# Patient Record
Sex: Female | Born: 1935
Health system: Southern US, Community
[De-identification: ages and names within clinical notes are randomized; demographics above are authoritative.]

## PROBLEM LIST (undated history)

## (undated) DIAGNOSIS — E041 Nontoxic single thyroid nodule: Secondary | ICD-10-CM

## (undated) DIAGNOSIS — N3281 Overactive bladder: Secondary | ICD-10-CM

## (undated) DIAGNOSIS — H9190 Unspecified hearing loss, unspecified ear: Secondary | ICD-10-CM

## (undated) DIAGNOSIS — M1990 Unspecified osteoarthritis, unspecified site: Secondary | ICD-10-CM

## (undated) DIAGNOSIS — M47819 Spondylosis without myelopathy or radiculopathy, site unspecified: Secondary | ICD-10-CM

## (undated) DIAGNOSIS — I6529 Occlusion and stenosis of unspecified carotid artery: Secondary | ICD-10-CM

## (undated) DIAGNOSIS — E785 Hyperlipidemia, unspecified: Secondary | ICD-10-CM

## (undated) DIAGNOSIS — I10 Essential (primary) hypertension: Secondary | ICD-10-CM

## (undated) DIAGNOSIS — E663 Overweight: Secondary | ICD-10-CM

## (undated) DIAGNOSIS — M519 Unspecified thoracic, thoracolumbar and lumbosacral intervertebral disc disorder: Secondary | ICD-10-CM

## (undated) DIAGNOSIS — C439 Malignant melanoma of skin, unspecified: Secondary | ICD-10-CM

## (undated) DIAGNOSIS — M25569 Pain in unspecified knee: Secondary | ICD-10-CM

## (undated) DIAGNOSIS — S83249A Other tear of medial meniscus, current injury, unspecified knee, initial encounter: Secondary | ICD-10-CM

## (undated) HISTORY — DX: Pain in unspecified knee: M25.569

## (undated) HISTORY — DX: Unspecified osteoarthritis, unspecified site: M19.90

## (undated) HISTORY — DX: Occlusion and stenosis of unspecified carotid artery: I65.29

## (undated) HISTORY — DX: Unspecified thoracic, thoracolumbar and lumbosacral intervertebral disc disorder: M51.9

## (undated) HISTORY — DX: Overactive bladder: N32.81

## (undated) HISTORY — DX: Hyperlipidemia, unspecified: E78.5

## (undated) HISTORY — DX: Unspecified hearing loss, unspecified ear: H91.90

## (undated) HISTORY — PX: OTHER SURGICAL HISTORY: SHX169

## (undated) HISTORY — DX: Malignant melanoma of skin, unspecified: C43.9

## (undated) HISTORY — DX: Essential (primary) hypertension: I10

## (undated) HISTORY — PX: APPENDECTOMY: SHX54

## (undated) HISTORY — PX: VESICOVAGINAL FISTULA CLOSURE W/ TAH: SUR271

## (undated) HISTORY — PX: CATARACT EXTRACTION: SUR2

## (undated) HISTORY — PX: EXPLORATORY LAPAROTOMY: SUR591

## (undated) HISTORY — DX: Other tear of medial meniscus, current injury, unspecified knee, initial encounter: S83.249A

## (undated) HISTORY — DX: Overweight: E66.3

## (undated) HISTORY — DX: Nontoxic single thyroid nodule: E04.1

## (undated) HISTORY — DX: Spondylosis without myelopathy or radiculopathy, site unspecified: M47.819

---

## 2001-02-15 ENCOUNTER — Other Ambulatory Visit: Admission: RE | Admit: 2001-02-15 | Discharge: 2001-02-15 | Payer: Self-pay | Admitting: Family Medicine

## 2001-03-02 ENCOUNTER — Encounter: Payer: Self-pay | Admitting: Family Medicine

## 2001-03-02 ENCOUNTER — Ambulatory Visit (HOSPITAL_COMMUNITY): Admission: RE | Admit: 2001-03-02 | Discharge: 2001-03-02 | Payer: Self-pay | Admitting: Family Medicine

## 2001-10-20 ENCOUNTER — Ambulatory Visit (HOSPITAL_COMMUNITY): Admission: RE | Admit: 2001-10-20 | Discharge: 2001-10-20 | Payer: Self-pay | Admitting: General Surgery

## 2001-12-13 ENCOUNTER — Encounter: Payer: Self-pay | Admitting: Family Medicine

## 2001-12-13 ENCOUNTER — Ambulatory Visit (HOSPITAL_COMMUNITY): Admission: RE | Admit: 2001-12-13 | Discharge: 2001-12-13 | Payer: Self-pay | Admitting: Family Medicine

## 2002-03-02 ENCOUNTER — Encounter: Payer: Self-pay | Admitting: Family Medicine

## 2002-03-02 ENCOUNTER — Ambulatory Visit (HOSPITAL_COMMUNITY): Admission: RE | Admit: 2002-03-02 | Discharge: 2002-03-02 | Payer: Self-pay | Admitting: Family Medicine

## 2003-03-06 ENCOUNTER — Ambulatory Visit (HOSPITAL_COMMUNITY): Admission: RE | Admit: 2003-03-06 | Discharge: 2003-03-06 | Payer: Self-pay | Admitting: Family Medicine

## 2003-12-11 ENCOUNTER — Other Ambulatory Visit: Admission: RE | Admit: 2003-12-11 | Discharge: 2003-12-11 | Payer: Self-pay | Admitting: General Surgery

## 2004-03-07 ENCOUNTER — Ambulatory Visit (HOSPITAL_COMMUNITY): Admission: RE | Admit: 2004-03-07 | Discharge: 2004-03-07 | Payer: Self-pay | Admitting: Family Medicine

## 2004-07-29 ENCOUNTER — Ambulatory Visit: Payer: Self-pay | Admitting: Orthopedic Surgery

## 2005-02-02 ENCOUNTER — Encounter (INDEPENDENT_AMBULATORY_CARE_PROVIDER_SITE_OTHER): Payer: Self-pay | Admitting: Family Medicine

## 2005-02-02 LAB — CONVERTED CEMR LAB: Pap Smear: NORMAL

## 2005-03-10 ENCOUNTER — Ambulatory Visit (HOSPITAL_COMMUNITY): Admission: RE | Admit: 2005-03-10 | Discharge: 2005-03-10 | Payer: Self-pay | Admitting: Family Medicine

## 2005-06-12 ENCOUNTER — Ambulatory Visit: Payer: Self-pay | Admitting: Orthopedic Surgery

## 2005-06-19 ENCOUNTER — Ambulatory Visit (HOSPITAL_COMMUNITY): Admission: RE | Admit: 2005-06-19 | Discharge: 2005-06-19 | Payer: Self-pay | Admitting: General Surgery

## 2005-10-01 ENCOUNTER — Encounter (INDEPENDENT_AMBULATORY_CARE_PROVIDER_SITE_OTHER): Payer: Self-pay | Admitting: Family Medicine

## 2005-10-01 LAB — CONVERTED CEMR LAB: Blood Glucose, Fasting: 111 mg/dL

## 2005-12-29 ENCOUNTER — Ambulatory Visit: Payer: Self-pay | Admitting: Orthopedic Surgery

## 2006-02-02 ENCOUNTER — Ambulatory Visit: Payer: Self-pay | Admitting: Family Medicine

## 2006-03-17 ENCOUNTER — Encounter (INDEPENDENT_AMBULATORY_CARE_PROVIDER_SITE_OTHER): Payer: Self-pay | Admitting: Family Medicine

## 2006-03-18 ENCOUNTER — Ambulatory Visit: Payer: Self-pay | Admitting: Family Medicine

## 2006-03-19 ENCOUNTER — Ambulatory Visit (HOSPITAL_COMMUNITY): Admission: RE | Admit: 2006-03-19 | Discharge: 2006-03-19 | Payer: Self-pay | Admitting: Family Medicine

## 2006-04-07 ENCOUNTER — Encounter: Payer: Self-pay | Admitting: Family Medicine

## 2006-04-07 DIAGNOSIS — K5900 Constipation, unspecified: Secondary | ICD-10-CM | POA: Insufficient documentation

## 2006-04-07 DIAGNOSIS — H269 Unspecified cataract: Secondary | ICD-10-CM | POA: Insufficient documentation

## 2006-04-07 DIAGNOSIS — N318 Other neuromuscular dysfunction of bladder: Secondary | ICD-10-CM | POA: Insufficient documentation

## 2006-04-07 DIAGNOSIS — I10 Essential (primary) hypertension: Secondary | ICD-10-CM | POA: Insufficient documentation

## 2006-04-07 DIAGNOSIS — H409 Unspecified glaucoma: Secondary | ICD-10-CM | POA: Insufficient documentation

## 2006-04-07 DIAGNOSIS — M129 Arthropathy, unspecified: Secondary | ICD-10-CM | POA: Insufficient documentation

## 2006-04-07 DIAGNOSIS — E785 Hyperlipidemia, unspecified: Secondary | ICD-10-CM | POA: Insufficient documentation

## 2006-04-13 ENCOUNTER — Ambulatory Visit: Payer: Self-pay | Admitting: Family Medicine

## 2006-04-14 ENCOUNTER — Ambulatory Visit (HOSPITAL_COMMUNITY): Admission: RE | Admit: 2006-04-14 | Discharge: 2006-04-14 | Payer: Self-pay | Admitting: Family Medicine

## 2006-04-16 ENCOUNTER — Encounter (INDEPENDENT_AMBULATORY_CARE_PROVIDER_SITE_OTHER): Payer: Self-pay | Admitting: Family Medicine

## 2006-04-16 ENCOUNTER — Ambulatory Visit (HOSPITAL_COMMUNITY): Admission: RE | Admit: 2006-04-16 | Discharge: 2006-04-16 | Payer: Self-pay | Admitting: Family Medicine

## 2006-05-11 ENCOUNTER — Encounter (INDEPENDENT_AMBULATORY_CARE_PROVIDER_SITE_OTHER): Payer: Self-pay | Admitting: Family Medicine

## 2006-05-11 ENCOUNTER — Ambulatory Visit: Payer: Self-pay | Admitting: Internal Medicine

## 2006-07-27 ENCOUNTER — Encounter (INDEPENDENT_AMBULATORY_CARE_PROVIDER_SITE_OTHER): Payer: Self-pay | Admitting: Family Medicine

## 2006-08-10 ENCOUNTER — Ambulatory Visit: Payer: Self-pay | Admitting: Family Medicine

## 2006-08-10 LAB — CONVERTED CEMR LAB: HDL goal, serum: 40 mg/dL

## 2006-08-11 LAB — CONVERTED CEMR LAB
Alkaline Phosphatase: 50 units/L (ref 39–117)
CO2: 24 meq/L (ref 19–32)
Cholesterol: 221 mg/dL — ABNORMAL HIGH (ref 0–200)
Creatinine, Ser: 0.76 mg/dL (ref 0.40–1.20)
Glucose, Bld: 100 mg/dL — ABNORMAL HIGH (ref 70–99)
LDL Cholesterol: 145 mg/dL — ABNORMAL HIGH (ref 0–99)
Sodium: 143 meq/L (ref 135–145)
Total Bilirubin: 0.7 mg/dL (ref 0.3–1.2)
Total CHOL/HDL Ratio: 4.3
Triglycerides: 125 mg/dL (ref <150)
VLDL: 25 mg/dL (ref 0–40)

## 2006-08-20 ENCOUNTER — Telehealth (INDEPENDENT_AMBULATORY_CARE_PROVIDER_SITE_OTHER): Payer: Self-pay | Admitting: Family Medicine

## 2006-08-24 ENCOUNTER — Ambulatory Visit (HOSPITAL_COMMUNITY): Admission: RE | Admit: 2006-08-24 | Discharge: 2006-08-24 | Payer: Self-pay | Admitting: Family Medicine

## 2006-08-26 ENCOUNTER — Encounter (INDEPENDENT_AMBULATORY_CARE_PROVIDER_SITE_OTHER): Payer: Self-pay | Admitting: Family Medicine

## 2006-08-26 DIAGNOSIS — IMO0002 Reserved for concepts with insufficient information to code with codable children: Secondary | ICD-10-CM | POA: Insufficient documentation

## 2006-09-23 ENCOUNTER — Ambulatory Visit: Payer: Self-pay | Admitting: Orthopedic Surgery

## 2006-09-25 ENCOUNTER — Ambulatory Visit: Payer: Self-pay | Admitting: Family Medicine

## 2006-09-29 LAB — CONVERTED CEMR LAB
ALT: 25 units/L (ref 0–35)
Albumin: 4.2 g/dL (ref 3.5–5.2)
Total Protein: 6.6 g/dL (ref 6.0–8.3)

## 2006-10-14 ENCOUNTER — Telehealth (INDEPENDENT_AMBULATORY_CARE_PROVIDER_SITE_OTHER): Payer: Self-pay | Admitting: Family Medicine

## 2006-10-26 ENCOUNTER — Ambulatory Visit: Payer: Self-pay | Admitting: Orthopedic Surgery

## 2006-10-26 ENCOUNTER — Encounter (INDEPENDENT_AMBULATORY_CARE_PROVIDER_SITE_OTHER): Payer: Self-pay | Admitting: Family Medicine

## 2006-10-28 ENCOUNTER — Telehealth (INDEPENDENT_AMBULATORY_CARE_PROVIDER_SITE_OTHER): Payer: Self-pay | Admitting: Family Medicine

## 2006-11-17 ENCOUNTER — Encounter (INDEPENDENT_AMBULATORY_CARE_PROVIDER_SITE_OTHER): Payer: Self-pay | Admitting: Family Medicine

## 2006-11-18 ENCOUNTER — Ambulatory Visit: Payer: Self-pay | Admitting: Family Medicine

## 2006-11-18 DIAGNOSIS — E663 Overweight: Secondary | ICD-10-CM | POA: Insufficient documentation

## 2006-11-20 ENCOUNTER — Encounter (INDEPENDENT_AMBULATORY_CARE_PROVIDER_SITE_OTHER): Payer: Self-pay | Admitting: Family Medicine

## 2006-11-22 LAB — CONVERTED CEMR LAB
ALT: 18 units/L (ref 0–35)
AST: 19 units/L (ref 0–37)
Albumin: 4.2 g/dL (ref 3.5–5.2)
Alkaline Phosphatase: 49 units/L (ref 39–117)
Chloride: 104 meq/L (ref 96–112)
HDL: 66 mg/dL (ref >39)
LDL Cholesterol: 103 mg/dL — ABNORMAL HIGH (ref 0–99)
Potassium: 3.9 meq/L (ref 3.5–5.3)
Sodium: 143 meq/L (ref 135–145)
Total Protein: 6.6 g/dL (ref 6.0–8.3)

## 2006-11-23 ENCOUNTER — Encounter (INDEPENDENT_AMBULATORY_CARE_PROVIDER_SITE_OTHER): Payer: Self-pay | Admitting: Family Medicine

## 2006-11-23 ENCOUNTER — Telehealth (INDEPENDENT_AMBULATORY_CARE_PROVIDER_SITE_OTHER): Payer: Self-pay | Admitting: *Deleted

## 2006-11-23 ENCOUNTER — Ambulatory Visit: Payer: Self-pay | Admitting: Orthopedic Surgery

## 2006-12-09 ENCOUNTER — Ambulatory Visit: Payer: Self-pay | Admitting: Orthopedic Surgery

## 2006-12-30 ENCOUNTER — Ambulatory Visit: Payer: Self-pay | Admitting: Orthopedic Surgery

## 2006-12-31 ENCOUNTER — Encounter: Payer: Self-pay | Admitting: Orthopedic Surgery

## 2007-01-01 ENCOUNTER — Ambulatory Visit (HOSPITAL_COMMUNITY): Admission: RE | Admit: 2007-01-01 | Discharge: 2007-01-01 | Payer: Self-pay | Admitting: Orthopedic Surgery

## 2007-01-01 ENCOUNTER — Ambulatory Visit: Payer: Self-pay | Admitting: Orthopedic Surgery

## 2007-01-05 ENCOUNTER — Ambulatory Visit: Payer: Self-pay | Admitting: Orthopedic Surgery

## 2007-01-06 ENCOUNTER — Encounter (HOSPITAL_COMMUNITY): Admission: RE | Admit: 2007-01-06 | Discharge: 2007-02-02 | Payer: Self-pay | Admitting: Orthopedic Surgery

## 2007-01-12 ENCOUNTER — Telehealth (INDEPENDENT_AMBULATORY_CARE_PROVIDER_SITE_OTHER): Payer: Self-pay | Admitting: *Deleted

## 2007-01-20 ENCOUNTER — Ambulatory Visit: Payer: Self-pay | Admitting: Orthopedic Surgery

## 2007-02-04 ENCOUNTER — Ambulatory Visit: Payer: Self-pay | Admitting: Family Medicine

## 2007-02-04 DIAGNOSIS — H9190 Unspecified hearing loss, unspecified ear: Secondary | ICD-10-CM | POA: Insufficient documentation

## 2007-02-10 ENCOUNTER — Telehealth (INDEPENDENT_AMBULATORY_CARE_PROVIDER_SITE_OTHER): Payer: Self-pay | Admitting: *Deleted

## 2007-02-17 ENCOUNTER — Ambulatory Visit: Payer: Self-pay | Admitting: Orthopedic Surgery

## 2007-02-17 DIAGNOSIS — M479 Spondylosis, unspecified: Secondary | ICD-10-CM | POA: Insufficient documentation

## 2007-02-23 ENCOUNTER — Telehealth: Payer: Self-pay | Admitting: Orthopedic Surgery

## 2007-02-26 ENCOUNTER — Ambulatory Visit (HOSPITAL_COMMUNITY): Admission: RE | Admit: 2007-02-26 | Discharge: 2007-02-26 | Payer: Self-pay | Admitting: Orthopedic Surgery

## 2007-03-12 ENCOUNTER — Ambulatory Visit: Payer: Self-pay | Admitting: Family Medicine

## 2007-03-12 DIAGNOSIS — M5137 Other intervertebral disc degeneration, lumbosacral region: Secondary | ICD-10-CM

## 2007-03-12 LAB — CONVERTED CEMR LAB: LDL Goal: 160 mg/dL

## 2007-03-15 ENCOUNTER — Encounter: Payer: Self-pay | Admitting: Orthopedic Surgery

## 2007-03-18 ENCOUNTER — Telehealth (INDEPENDENT_AMBULATORY_CARE_PROVIDER_SITE_OTHER): Payer: Self-pay | Admitting: Family Medicine

## 2007-03-20 ENCOUNTER — Encounter (INDEPENDENT_AMBULATORY_CARE_PROVIDER_SITE_OTHER): Payer: Self-pay | Admitting: Family Medicine

## 2007-03-29 ENCOUNTER — Encounter: Admission: RE | Admit: 2007-03-29 | Discharge: 2007-03-29 | Payer: Self-pay | Admitting: Orthopedic Surgery

## 2007-03-30 ENCOUNTER — Encounter: Payer: Self-pay | Admitting: Orthopedic Surgery

## 2007-04-10 ENCOUNTER — Encounter (INDEPENDENT_AMBULATORY_CARE_PROVIDER_SITE_OTHER): Payer: Self-pay | Admitting: Family Medicine

## 2007-04-12 ENCOUNTER — Encounter: Admission: RE | Admit: 2007-04-12 | Discharge: 2007-04-12 | Payer: Self-pay | Admitting: Orthopedic Surgery

## 2007-04-21 ENCOUNTER — Ambulatory Visit: Payer: Self-pay | Admitting: Family Medicine

## 2007-04-21 ENCOUNTER — Telehealth (INDEPENDENT_AMBULATORY_CARE_PROVIDER_SITE_OTHER): Payer: Self-pay | Admitting: *Deleted

## 2007-04-22 ENCOUNTER — Telehealth (INDEPENDENT_AMBULATORY_CARE_PROVIDER_SITE_OTHER): Payer: Self-pay | Admitting: *Deleted

## 2007-04-22 LAB — CONVERTED CEMR LAB
ALT: 22 units/L (ref 0–35)
Basophils Absolute: 0 10*3/uL (ref 0.0–0.1)
CO2: 24 meq/L (ref 19–32)
Calcium: 9.2 mg/dL (ref 8.4–10.5)
Chloride: 101 meq/L (ref 96–112)
Cholesterol: 202 mg/dL — ABNORMAL HIGH (ref 0–200)
Creatinine, Ser: 0.8 mg/dL (ref 0.40–1.20)
Glucose, Bld: 103 mg/dL — ABNORMAL HIGH (ref 70–99)
Hemoglobin: 13.6 g/dL (ref 12.0–15.0)
Lymphocytes Relative: 35 % (ref 12–46)
Lymphs Abs: 2.1 10*3/uL (ref 0.7–4.0)
Monocytes Absolute: 0.6 10*3/uL (ref 0.1–1.0)
Neutro Abs: 3.3 10*3/uL (ref 1.7–7.7)
RDW: 15.8 % — ABNORMAL HIGH (ref 11.5–15.5)
Sodium: 140 meq/L (ref 135–145)
Total Protein: 6.7 g/dL (ref 6.0–8.3)
WBC: 6 10*3/uL (ref 4.0–10.5)

## 2007-04-23 ENCOUNTER — Ambulatory Visit (HOSPITAL_COMMUNITY): Admission: RE | Admit: 2007-04-23 | Discharge: 2007-04-23 | Payer: Self-pay | Admitting: Family Medicine

## 2007-04-27 ENCOUNTER — Telehealth (INDEPENDENT_AMBULATORY_CARE_PROVIDER_SITE_OTHER): Payer: Self-pay | Admitting: *Deleted

## 2007-05-05 ENCOUNTER — Ambulatory Visit: Payer: Self-pay | Admitting: Family Medicine

## 2007-05-05 ENCOUNTER — Telehealth (INDEPENDENT_AMBULATORY_CARE_PROVIDER_SITE_OTHER): Payer: Self-pay | Admitting: *Deleted

## 2007-05-11 ENCOUNTER — Telehealth (INDEPENDENT_AMBULATORY_CARE_PROVIDER_SITE_OTHER): Payer: Self-pay | Admitting: *Deleted

## 2007-05-11 ENCOUNTER — Ambulatory Visit (HOSPITAL_COMMUNITY): Admission: RE | Admit: 2007-05-11 | Discharge: 2007-05-11 | Payer: Self-pay | Admitting: Family Medicine

## 2007-05-11 ENCOUNTER — Encounter (INDEPENDENT_AMBULATORY_CARE_PROVIDER_SITE_OTHER): Payer: Self-pay | Admitting: Family Medicine

## 2007-05-14 ENCOUNTER — Ambulatory Visit (HOSPITAL_COMMUNITY): Admission: RE | Admit: 2007-05-14 | Discharge: 2007-05-14 | Payer: Self-pay | Admitting: Family Medicine

## 2007-05-14 ENCOUNTER — Encounter (INDEPENDENT_AMBULATORY_CARE_PROVIDER_SITE_OTHER): Payer: Self-pay | Admitting: Family Medicine

## 2007-05-14 ENCOUNTER — Encounter (INDEPENDENT_AMBULATORY_CARE_PROVIDER_SITE_OTHER): Payer: Self-pay | Admitting: Diagnostic Radiology

## 2007-05-19 ENCOUNTER — Telehealth (INDEPENDENT_AMBULATORY_CARE_PROVIDER_SITE_OTHER): Payer: Self-pay | Admitting: *Deleted

## 2007-05-20 ENCOUNTER — Telehealth (INDEPENDENT_AMBULATORY_CARE_PROVIDER_SITE_OTHER): Payer: Self-pay | Admitting: *Deleted

## 2007-06-04 ENCOUNTER — Ambulatory Visit: Payer: Self-pay | Admitting: Family Medicine

## 2007-06-04 LAB — CONVERTED CEMR LAB

## 2007-07-26 ENCOUNTER — Telehealth: Payer: Self-pay | Admitting: Orthopedic Surgery

## 2007-07-30 ENCOUNTER — Telehealth: Payer: Self-pay | Admitting: Orthopedic Surgery

## 2007-08-02 ENCOUNTER — Encounter: Payer: Self-pay | Admitting: Orthopedic Surgery

## 2007-08-06 ENCOUNTER — Inpatient Hospital Stay (HOSPITAL_COMMUNITY): Admission: RE | Admit: 2007-08-06 | Discharge: 2007-08-13 | Payer: Self-pay | Admitting: Neurosurgery

## 2007-08-20 ENCOUNTER — Telehealth (INDEPENDENT_AMBULATORY_CARE_PROVIDER_SITE_OTHER): Payer: Self-pay | Admitting: Family Medicine

## 2007-08-31 ENCOUNTER — Encounter: Payer: Self-pay | Admitting: Orthopedic Surgery

## 2007-09-01 ENCOUNTER — Ambulatory Visit: Payer: Self-pay | Admitting: Family Medicine

## 2007-09-01 LAB — CONVERTED CEMR LAB
Bilirubin Urine: NEGATIVE
Blood in Urine, dipstick: NEGATIVE
Ketones, urine, test strip: NEGATIVE

## 2007-10-06 ENCOUNTER — Ambulatory Visit: Payer: Self-pay | Admitting: Family Medicine

## 2007-10-07 ENCOUNTER — Encounter (INDEPENDENT_AMBULATORY_CARE_PROVIDER_SITE_OTHER): Payer: Self-pay | Admitting: Family Medicine

## 2007-10-08 LAB — CONVERTED CEMR LAB
ALT: 11 units/L (ref 0–35)
AST: 15 units/L (ref 0–37)
Albumin: 4 g/dL (ref 3.5–5.2)
Calcium: 9 mg/dL (ref 8.4–10.5)
Chloride: 103 meq/L (ref 96–112)
Potassium: 4 meq/L (ref 3.5–5.3)
Sodium: 142 meq/L (ref 135–145)

## 2007-10-12 ENCOUNTER — Encounter: Payer: Self-pay | Admitting: Orthopedic Surgery

## 2008-01-13 ENCOUNTER — Ambulatory Visit: Payer: Self-pay | Admitting: Family Medicine

## 2008-01-13 LAB — CONVERTED CEMR LAB
Ketones, urine, test strip: NEGATIVE
Nitrite: NEGATIVE
Specific Gravity, Urine: 1.015
WBC Urine, dipstick: NEGATIVE
pH: 7.5

## 2008-03-23 ENCOUNTER — Telehealth (INDEPENDENT_AMBULATORY_CARE_PROVIDER_SITE_OTHER): Payer: Self-pay | Admitting: *Deleted

## 2008-04-06 ENCOUNTER — Encounter: Payer: Self-pay | Admitting: Orthopedic Surgery

## 2008-04-14 ENCOUNTER — Ambulatory Visit: Payer: Self-pay | Admitting: Family Medicine

## 2008-04-19 ENCOUNTER — Encounter (INDEPENDENT_AMBULATORY_CARE_PROVIDER_SITE_OTHER): Payer: Self-pay | Admitting: Family Medicine

## 2008-04-20 LAB — CONVERTED CEMR LAB
AST: 21 units/L (ref 0–37)
Alkaline Phosphatase: 62 units/L (ref 39–117)
BUN: 17 mg/dL (ref 6–23)
Basophils Relative: 0 % (ref 0–1)
Creatinine, Ser: 1.1 mg/dL (ref 0.40–1.20)
Eosinophils Absolute: 0.2 10*3/uL (ref 0.0–0.7)
Eosinophils Relative: 5 % (ref 0–5)
HDL: 63 mg/dL (ref >39)
LDL Cholesterol: 113 mg/dL — ABNORMAL HIGH (ref 0–99)
MCHC: 30.9 g/dL (ref 30.0–36.0)
MCV: 88.5 fL (ref 78.0–100.0)
Monocytes Absolute: 0.6 10*3/uL (ref 0.1–1.0)
Monocytes Relative: 14 % — ABNORMAL HIGH (ref 3–12)
Neutrophils Relative %: 27 % — ABNORMAL LOW (ref 43–77)
Potassium: 3.8 meq/L (ref 3.5–5.3)
RBC: 4.68 M/uL (ref 3.87–5.11)
TSH: 1.325 microintl units/mL (ref 0.350–4.50)
Total Bilirubin: 0.5 mg/dL (ref 0.3–1.2)
Total CHOL/HDL Ratio: 3
VLDL: 15 mg/dL (ref 0–40)

## 2008-05-19 ENCOUNTER — Ambulatory Visit (HOSPITAL_COMMUNITY): Admission: RE | Admit: 2008-05-19 | Discharge: 2008-05-19 | Payer: Self-pay | Admitting: Family Medicine

## 2008-05-23 ENCOUNTER — Encounter (INDEPENDENT_AMBULATORY_CARE_PROVIDER_SITE_OTHER): Payer: Self-pay | Admitting: Family Medicine

## 2008-05-24 ENCOUNTER — Ambulatory Visit (HOSPITAL_COMMUNITY): Admission: RE | Admit: 2008-05-24 | Discharge: 2008-05-24 | Payer: Self-pay | Admitting: Family Medicine

## 2008-05-30 ENCOUNTER — Encounter (INDEPENDENT_AMBULATORY_CARE_PROVIDER_SITE_OTHER): Payer: Self-pay | Admitting: Family Medicine

## 2008-06-01 ENCOUNTER — Ambulatory Visit (HOSPITAL_COMMUNITY): Admission: RE | Admit: 2008-06-01 | Discharge: 2008-06-01 | Payer: Self-pay | Admitting: Family Medicine

## 2008-07-03 ENCOUNTER — Telehealth (INDEPENDENT_AMBULATORY_CARE_PROVIDER_SITE_OTHER): Payer: Self-pay | Admitting: Family Medicine

## 2008-07-13 ENCOUNTER — Ambulatory Visit: Payer: Self-pay | Admitting: Family Medicine

## 2008-07-13 DIAGNOSIS — M899 Disorder of bone, unspecified: Secondary | ICD-10-CM | POA: Insufficient documentation

## 2008-07-13 DIAGNOSIS — M949 Disorder of cartilage, unspecified: Secondary | ICD-10-CM

## 2008-08-01 ENCOUNTER — Ambulatory Visit: Payer: Self-pay | Admitting: Family Medicine

## 2008-08-01 DIAGNOSIS — R5381 Other malaise: Secondary | ICD-10-CM

## 2008-08-01 DIAGNOSIS — R5383 Other fatigue: Secondary | ICD-10-CM

## 2008-08-01 LAB — CONVERTED CEMR LAB
Nitrite: NEGATIVE
Protein, U semiquant: NEGATIVE
Specific Gravity, Urine: 1.015
pH: 5.5

## 2008-08-02 ENCOUNTER — Encounter (INDEPENDENT_AMBULATORY_CARE_PROVIDER_SITE_OTHER): Payer: Self-pay | Admitting: Family Medicine

## 2008-08-02 LAB — CONVERTED CEMR LAB
ALT: 14 units/L (ref 0–35)
BUN: 12 mg/dL (ref 6–23)
Basophils Relative: 1 % (ref 0–1)
CO2: 27 meq/L (ref 19–32)
Calcium: 9.3 mg/dL (ref 8.4–10.5)
Chloride: 101 meq/L (ref 96–112)
Creatinine, Ser: 0.83 mg/dL (ref 0.40–1.20)
Eosinophils Absolute: 0.1 10*3/uL (ref 0.0–0.7)
Eosinophils Relative: 2 % (ref 0–5)
HCT: 40.1 % (ref 36.0–46.0)
MCHC: 32.7 g/dL (ref 30.0–36.0)
MCV: 83.5 fL (ref 78.0–100.0)
Monocytes Relative: 7 % (ref 3–12)
Neutrophils Relative %: 34 % — ABNORMAL LOW (ref 43–77)
Platelets: 228 10*3/uL (ref 150–400)
TSH: 2.795 microintl units/mL (ref 0.350–4.500)
Total Bilirubin: 0.4 mg/dL (ref 0.3–1.2)

## 2008-08-03 ENCOUNTER — Ambulatory Visit: Payer: Self-pay | Admitting: Family Medicine

## 2008-08-08 ENCOUNTER — Encounter: Payer: Self-pay | Admitting: Orthopedic Surgery

## 2008-09-06 ENCOUNTER — Ambulatory Visit: Payer: Self-pay | Admitting: Internal Medicine

## 2008-09-06 DIAGNOSIS — J309 Allergic rhinitis, unspecified: Secondary | ICD-10-CM | POA: Insufficient documentation

## 2008-09-18 ENCOUNTER — Telehealth (INDEPENDENT_AMBULATORY_CARE_PROVIDER_SITE_OTHER): Payer: Self-pay | Admitting: Family Medicine

## 2008-10-06 ENCOUNTER — Ambulatory Visit: Payer: Self-pay | Admitting: Family Medicine

## 2008-10-06 DIAGNOSIS — T6391XA Toxic effect of contact with unspecified venomous animal, accidental (unintentional), initial encounter: Secondary | ICD-10-CM | POA: Insufficient documentation

## 2008-11-09 ENCOUNTER — Telehealth (INDEPENDENT_AMBULATORY_CARE_PROVIDER_SITE_OTHER): Payer: Self-pay | Admitting: *Deleted

## 2008-12-27 ENCOUNTER — Ambulatory Visit: Payer: Self-pay | Admitting: Family Medicine

## 2008-12-28 ENCOUNTER — Encounter (INDEPENDENT_AMBULATORY_CARE_PROVIDER_SITE_OTHER): Payer: Self-pay | Admitting: Family Medicine

## 2008-12-29 LAB — CONVERTED CEMR LAB
Albumin: 4.3 g/dL (ref 3.5–5.2)
Alkaline Phosphatase: 53 units/L (ref 39–117)
BUN: 13 mg/dL (ref 6–23)
Basophils Absolute: 0 10*3/uL (ref 0.0–0.1)
CO2: 27 meq/L (ref 19–32)
Calcium: 10.2 mg/dL (ref 8.4–10.5)
Chloride: 104 meq/L (ref 96–112)
Eosinophils Relative: 4 % (ref 0–5)
Glucose, Bld: 102 mg/dL — ABNORMAL HIGH (ref 70–99)
HCT: 41.3 % (ref 36.0–46.0)
HDL: 64 mg/dL (ref >39)
Hemoglobin: 13.5 g/dL (ref 12.0–15.0)
Ketones, ur: NEGATIVE mg/dL
LDL Cholesterol: 115 mg/dL — ABNORMAL HIGH (ref 0–99)
Lymphocytes Relative: 46 % (ref 12–46)
Lymphs Abs: 2.1 10*3/uL (ref 0.7–4.0)
Monocytes Absolute: 0.5 10*3/uL (ref 0.1–1.0)
Monocytes Relative: 11 % (ref 3–12)
Neutro Abs: 1.8 10*3/uL (ref 1.7–7.7)
Nitrite: NEGATIVE
Potassium: 4.6 meq/L (ref 3.5–5.3)
RBC: 4.77 M/uL (ref 3.87–5.11)
RDW: 14.6 % (ref 11.5–15.5)
Sodium: 144 meq/L (ref 135–145)
Specific Gravity, Urine: 1.023 (ref 1.005–1.030)
Total Protein: 7 g/dL (ref 6.0–8.3)
Triglycerides: 103 mg/dL (ref <150)
Urobilinogen, UA: 0.2 (ref 0.0–1.0)
pH: 6.5 (ref 5.0–8.0)

## 2009-01-01 ENCOUNTER — Ambulatory Visit (HOSPITAL_COMMUNITY): Admission: RE | Admit: 2009-01-01 | Discharge: 2009-01-01 | Payer: Self-pay | Admitting: Family Medicine

## 2009-01-01 ENCOUNTER — Ambulatory Visit: Payer: Self-pay | Admitting: Family Medicine

## 2009-01-01 DIAGNOSIS — T148 Other injury of unspecified body region: Secondary | ICD-10-CM

## 2009-01-01 DIAGNOSIS — W57XXXA Bitten or stung by nonvenomous insect and other nonvenomous arthropods, initial encounter: Secondary | ICD-10-CM

## 2009-01-23 ENCOUNTER — Encounter (INDEPENDENT_AMBULATORY_CARE_PROVIDER_SITE_OTHER): Payer: Self-pay | Admitting: Family Medicine

## 2009-02-03 IMAGING — MG MM DIGITAL SCREENING
5 series · 5 of 5 positions shown · non-contrast
Comparison: Prior studies 04-14-06.

DG SCREEN MAMMOGRAM BILATERAL
Bilateral CC and MLO view(s) were taken.

DIGITAL SCREENING MAMMOGRAM WITH CAD:

[L CC]
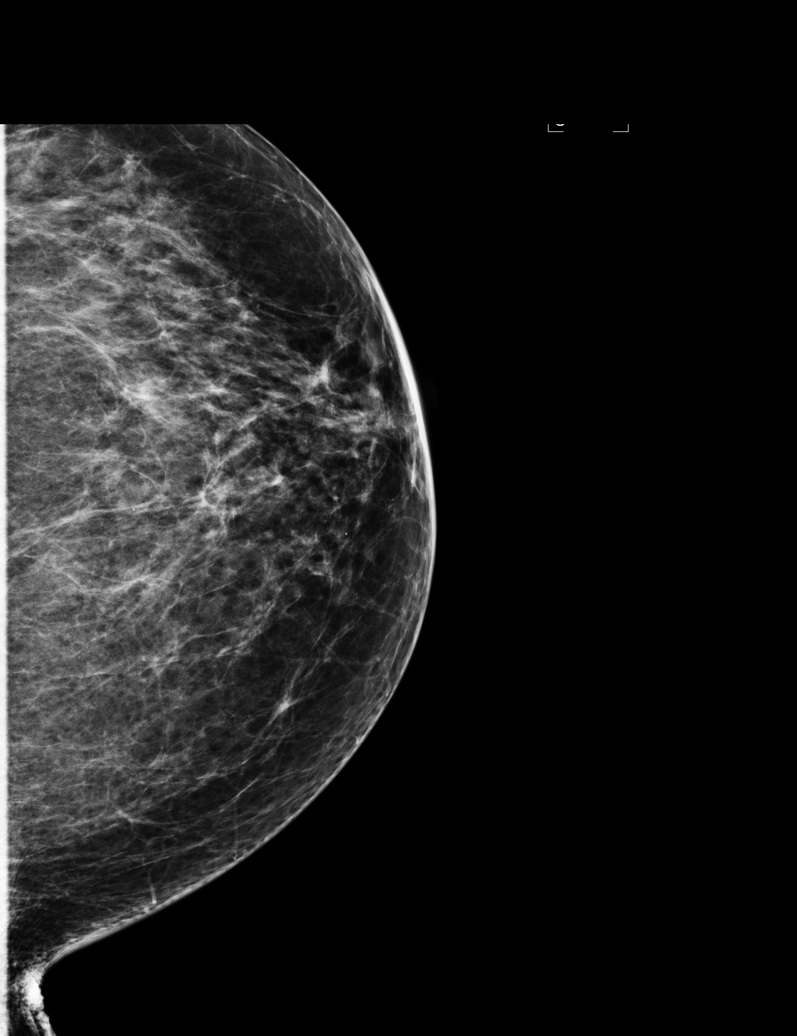

[L MLO]
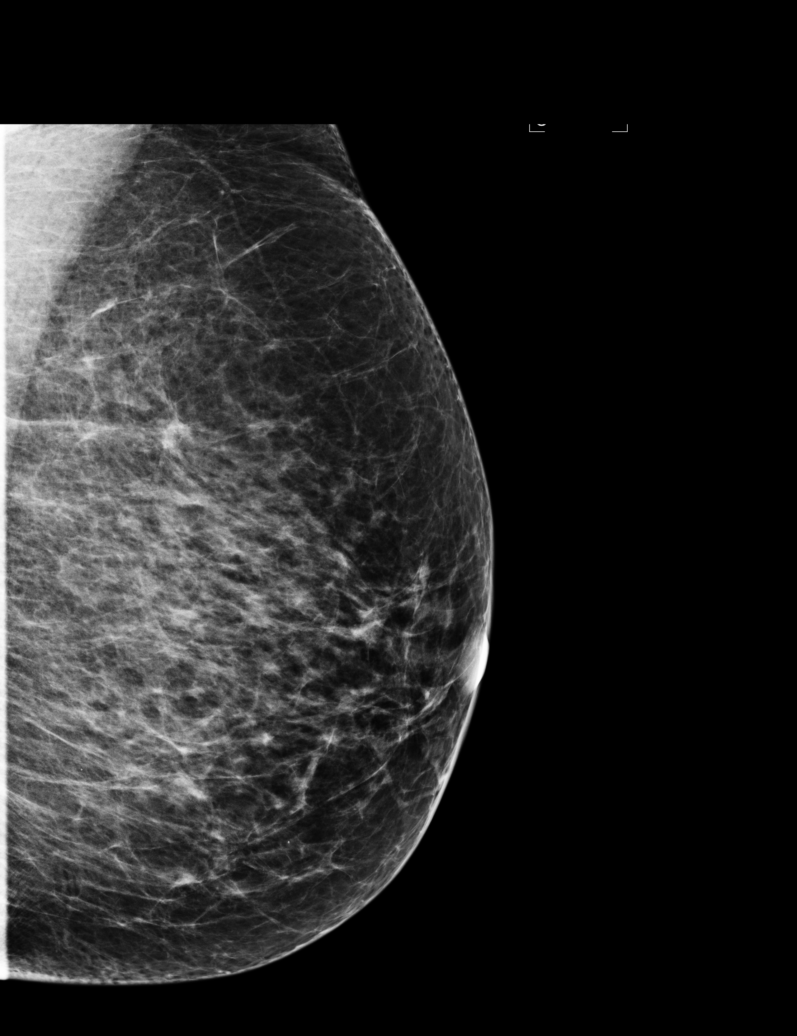

[R CC]
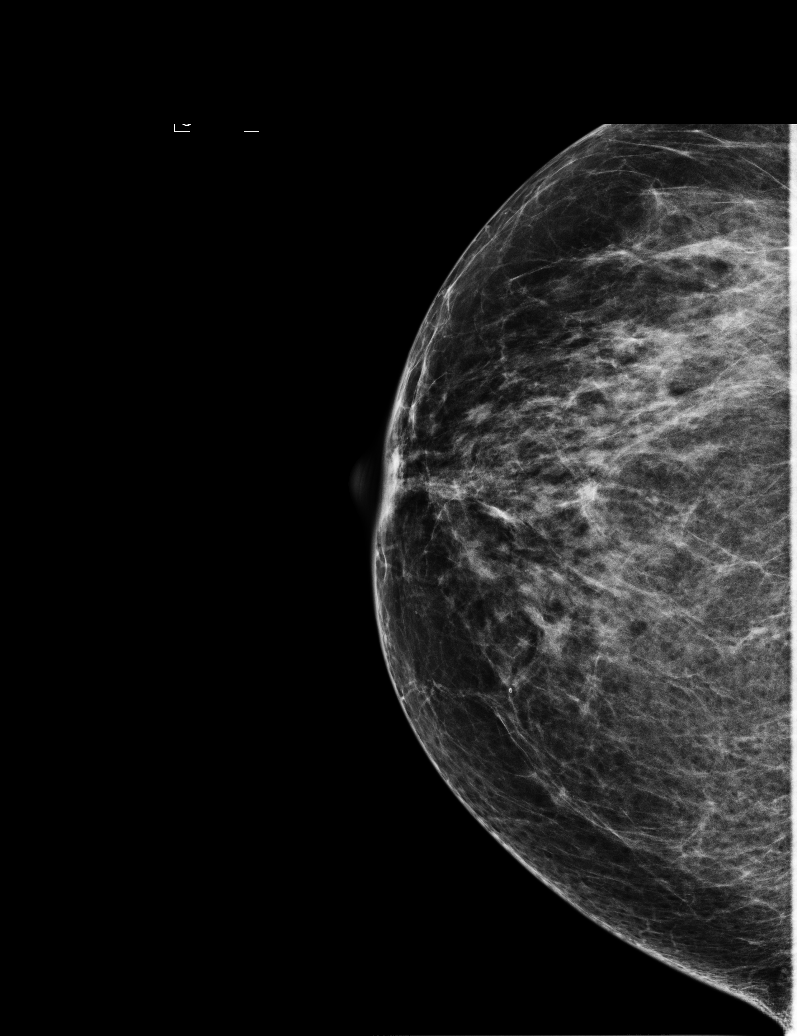

[R MLO (1 of 2)]
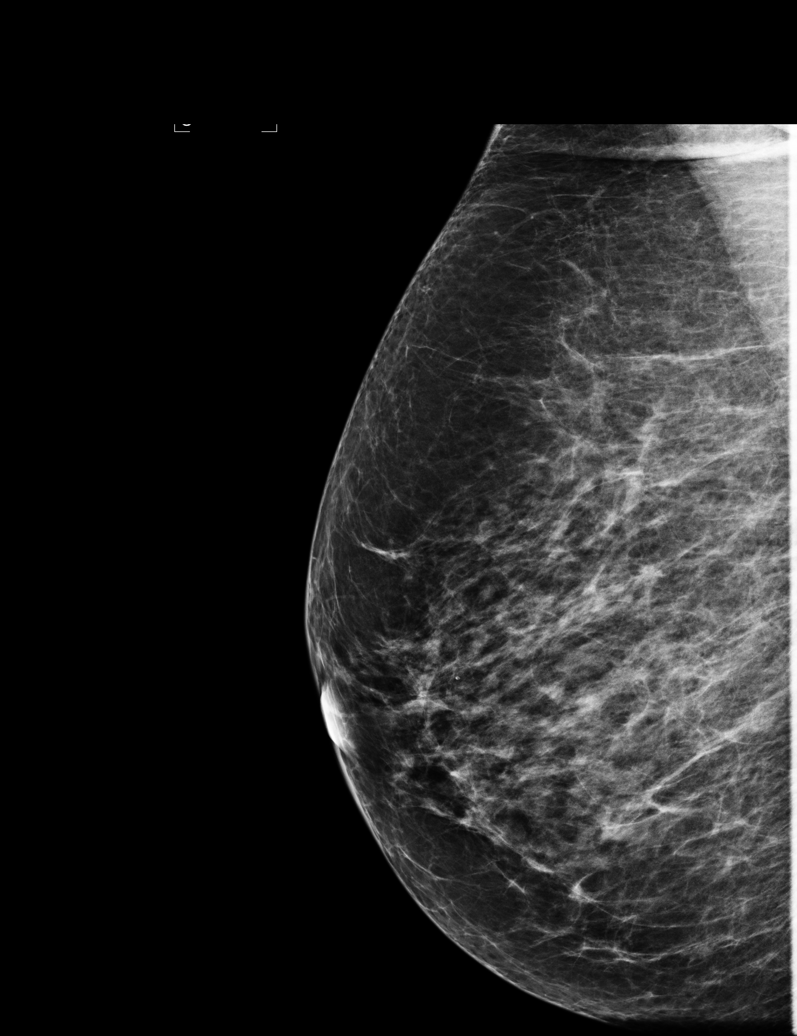

[R MLO (2 of 2)]
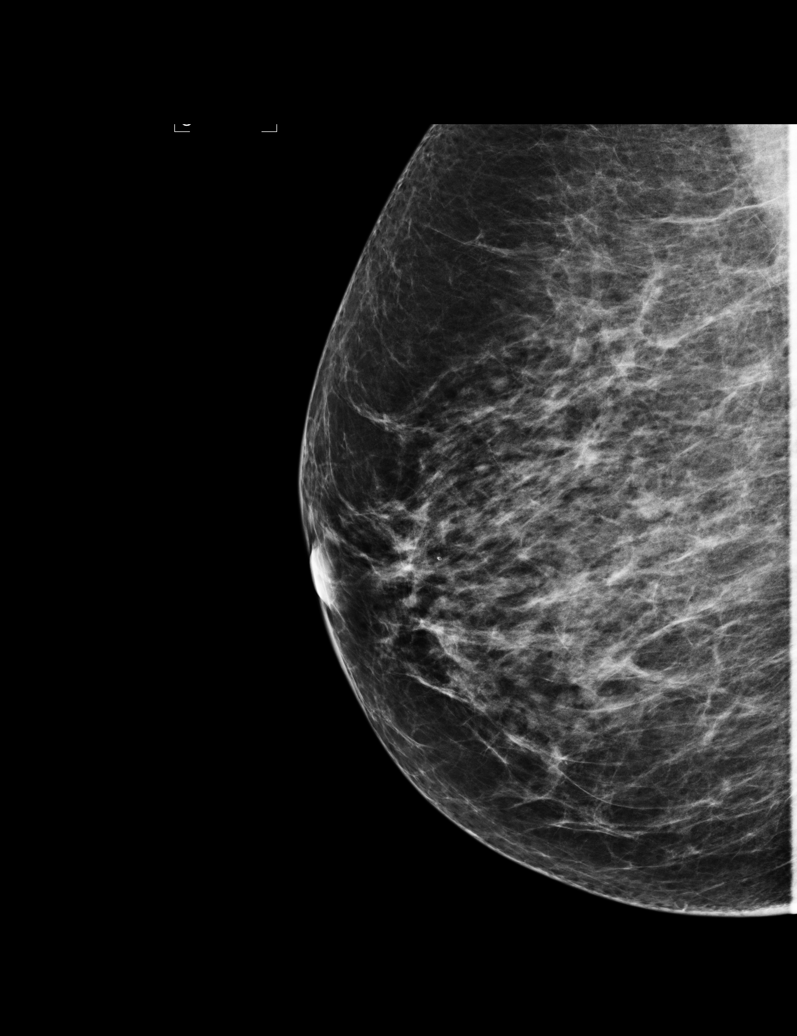

[5 of 5 positions shown; findings below may reference images not displayed]

There are scattered fibroglandular densities.  A possible mass is noted in the right breast.  Spot 
compression views and possibly sonography are recommended for further evaluation.  The left breast 
is unremarkable.
IMPRESSION: Possible mass, right breast.  Additional evaluation is indicated.  The patient will be contacted 
for additional studies and a supplemental report will follow.

ASSESSMENT: Need additional imaging evaluation and/or prior mammograms for comparison - BI-RADS 0 -
Right

Further imaging of the right breast.
THIS WAS ANALAYZED BY COMPUTER AIDED DETECTION. , THIS PROCEDURE WAS A DIGITAL MAMMOGRAM.

## 2009-06-04 ENCOUNTER — Ambulatory Visit (HOSPITAL_COMMUNITY): Admission: RE | Admit: 2009-06-04 | Discharge: 2009-06-04 | Payer: Self-pay | Admitting: Internal Medicine

## 2009-12-13 ENCOUNTER — Ambulatory Visit: Payer: Self-pay | Admitting: Gastroenterology

## 2009-12-13 DIAGNOSIS — K625 Hemorrhage of anus and rectum: Secondary | ICD-10-CM

## 2009-12-14 ENCOUNTER — Encounter: Payer: Self-pay | Admitting: Gastroenterology

## 2009-12-18 ENCOUNTER — Encounter: Payer: Self-pay | Admitting: Gastroenterology

## 2009-12-19 ENCOUNTER — Ambulatory Visit (HOSPITAL_COMMUNITY): Admission: RE | Admit: 2009-12-19 | Discharge: 2009-12-19 | Payer: Self-pay | Admitting: Gastroenterology

## 2009-12-19 ENCOUNTER — Ambulatory Visit: Payer: Self-pay | Admitting: Gastroenterology

## 2010-03-25 ENCOUNTER — Ambulatory Visit (HOSPITAL_COMMUNITY): Admission: RE | Admit: 2010-03-25 | Discharge: 2010-03-25 | Payer: Self-pay | Admitting: Internal Medicine

## 2010-04-16 ENCOUNTER — Encounter: Payer: Self-pay | Admitting: Orthopedic Surgery

## 2010-04-18 ENCOUNTER — Ambulatory Visit: Payer: Self-pay | Admitting: Orthopedic Surgery

## 2010-04-23 ENCOUNTER — Encounter: Payer: Self-pay | Admitting: Orthopedic Surgery

## 2010-04-23 DIAGNOSIS — M7580 Other shoulder lesions, unspecified shoulder: Secondary | ICD-10-CM

## 2010-05-26 ENCOUNTER — Encounter: Payer: Self-pay | Admitting: Internal Medicine

## 2010-06-04 ENCOUNTER — Ambulatory Visit: Admit: 2010-06-04 | Payer: Self-pay | Admitting: Orthopedic Surgery

## 2010-06-04 NOTE — Letter (Signed)
Summary: Internal Other/TCS orders  Internal Other/TCS orders   Imported By: Cloria Spring LPN 16/02/9603 54:09:81  _____________________________________________________________________  External Attachment:    Type:   Image     Comment:   External Document

## 2010-06-04 NOTE — Assessment & Plan Note (Signed)
Summary: rectal bleeding/ss   Visit Type:  Initial Consult Referring Provider:  BJYNW Primary Care Provider:  Fanta  Chief Complaint:  rectal bleeding.  History of Present Illness: Kirsten Hogan is a pleasant 77 y/o female, patient of Dr. Felecia Shelling, who presents for further evaluation of rectal bleeding. She c/o two weeks of intermittent brbpr on toilet tissue. Intermittent constipation. No abd pain, rectal pain. Appetite good, no weight loss. No heartburn, n/v, dysphagia.   On 12/11/09, Hgb 13.3. Per pt, DRE positive for blood.  Current Medications (verified): 1)  Verapamil Hcl Cr 240 Mg Tbcr (Verapamil Hcl) .... Once Daily 2)  Triamterene-Hctz 37.5-25 Mg Tabs (Triamterene-Hctz) .... Once Daily 3)  Klor-Con 10 10 Meq Tbcr (Potassium Chloride) .... Once Daily 4)  Pravachol 20 Mg  Tabs (Pravastatin Sodium) .... One Daily 5)  Caltrate 600+d Plus 600-400 Mg-Unit Tabs (Calcium Carbonate-Vit D-Min) .... One Two Times A Day 6)  Fish Oil 1000 Mg Caps (Omega-3 Fatty Acids) .... One Daily 7)  Multivitamins  Caps (Multiple Vitamin) .... One Daily 8)  Aspirin Adult Low Strength 81 Mg Tbec (Aspirin) .... One Daily 9)  Losartan Potassium 100 Mg Tabs (Losartan Potassium) .... One Daily  Allergies (verified): No Known Drug Allergies  Past History:  Past Medical History: THYROID NODULE, RIGHT - GOITER VS BENIGN CYST ON BIOPSY (ICD-241.0) ? CAROTID ARTERY STENOSIS, RIGHT (ICD-433.10) KNEE PAIN, RIGHT (ICD-719.46) DISC DISEASE, LUMBOSACRAL SPINE (ICD-722.52) SPONDYLOSIS NOS W/O MYELOPATHY (ICD-721.90) DECREASED HEARING (ICD-389.9) OVERWEIGHT (ICD-278.02) TEAR, MEDIAL MENISCUS, KNEE, CURRENT (ICD-836.0) MELANOMA (ICD-172.9) CATARACT NOS (ICD-366.9) GLAUCOMA NOS (ICD-365.9) ARTHRITIS (ICD-716.90) OVERACTIVE BLADDER (ICD-596.51) CONSTIPATION (ICD-564.00) HYPERTENSION (ICD-401.9) HYPERLIPIDEMIA (ICD-272.4) TCS in past with Dr. Katrinka Blazing  Past Surgical History: Reviewed history from 09/01/2007 and no  changes required. 1.Cataract extraction 2. Hysterectomy for excessive bleeding but no cancers 3. Laparotomy-exploratory 4. EXCISION OF FACIAL SKIN CANCER 5. BLADDER TUCK 6. Right Knee Arthorscopy - Dr. Romeo Apple - August 2008 - menisceal tear 7. Posterior interbody arthrodesis, L3-4, posterolateral arthrodesis, L3-4, pedicle screw fixation nonsegmental, S3-4, with  Legacy screws.  Under Dr. Damita Dunnings on August 06, 2007     Family History: Reviewed history from 12/27/2008 and no changes required. Father: Dead Colon Cancer Age unsure Mother: Dead Anuerysm - brain Siblings: Sister Dead at 67 CVA, Living x1 at 75 - Lung Cancer ?  Social History: Reviewed history from 08/10/2006 and no changes required. Occupation: Engineer, site, retired Married. Two daughters Never Smoked Alcohol use-no Drug use-no    Review of Systems General:  Denies fever, chills, sweats, anorexia, fatigue, weakness, and weight loss. Eyes:  Denies vision loss. ENT:  Denies nasal congestion, sore throat, hoarseness, and difficulty swallowing. CV:  Denies chest pains, angina, palpitations, dyspnea on exertion, and peripheral edema. Resp:  Denies dyspnea at rest, dyspnea with exercise, cough, sputum, and wheezing. GI:  See HPI. GU:  Denies urinary burning and blood in urine. MS:  Complains of joint pain / LOM. Derm:  Denies rash and itching. Neuro:  Denies weakness, frequent headaches, memory loss, and confusion. Psych:  Denies depression and anxiety. Endo:  Denies unusual weight change. Heme:  Denies bruising and bleeding. Allergy:  Denies hives and rash.  Vital Signs:  Patient profile:   77 year old female Height:      65 inches Weight:      155 pounds BMI:     25.89 Temp:     98.2 degrees F oral Pulse rate:   80 / minute BP sitting:   100 / 64  (left  arm) Cuff size:   regular  Vitals Entered By: Kirsten Spring LPN (December 13, 2009 1:56 PM)  Physical Exam  General:  Well developed, well nourished, no  acute distress. Head:  Normocephalic and atraumatic. Eyes:  sclera nonicteric Mouth:  Oropharyngeal mucosa moist, pink.  No lesions, erythema or exudate.    Neck:  Supple; no masses or thyromegaly. Lungs:  Clear throughout to auscultation. Heart:  Regular rate and rhythm; no murmurs, rubs,  or bruits. Abdomen:  Bowel sounds normal.  Abdomen is soft, nontender, nondistended.  No rebound or guarding.  No hepatosplenomegaly, masses or hernias.  No abdominal bruits.  Extremities:  No clubbing, cyanosis, edema or deformities noted. Neurologic:  Alert and  oriented x4;  grossly normal neurologically. Skin:  Intact without significant lesions or rashes. Cervical Nodes:  No significant cervical adenopathy. Psych:  Alert and cooperative. Normal mood and affect.  Impression & Recommendations:  Problem # 1:  RECTAL BLEEDING (ICD-569.3)  Recent hematachezia. Remote TCS. Discussed with patient, she is interested in TCS. Hgb normal. DDx includes benign anorectal source, polyp, less likely diverticular bleed, malignancy. Colonoscopy to be performed in near future.  Risks, alternatives, and benefits including but not limited to the risk of reaction to medication, bleeding, infection, and perforation were addressed.  Patient voiced understanding and provided verbal consent.   Hold ASA for four days.  Orders: Consultation Level IV (09811) I would like to thank Dr. Felecia Shelling for allowing [place] to take part in the care of this nice patient.

## 2010-06-06 NOTE — Assessment & Plan Note (Signed)
Summary: rt shoulder pain [facility]r at [facility]/bsf   Vital Signs:  Patient profile:   77 year old female Height:      64 inches Weight:      152 pounds Pulse rate:   68 / minute Resp:     16 per minute  Vitals Entered By: Fuller Canada MD (April 23, 2010 9:17 AM)  Visit Type:  new problem Referring Provider:  Felecia Shelling Primary Provider:  Felecia Shelling  CC:  right shoulder pain.  History of Present Illness: I saw Kirsten Hogan in the office today for a new problem visit.  She is a 77 years old woman with the complaint of:  right shoulder pain.  No injury.  Xrays [facility] 03/25/10 right shoulder   Meds: Verapamil, Triamterene, Losartan, Pravastatin, Tylenol.  The patient complains of RIGHT shoulder throbbing pain which comes and goes and is worse at night, came on gradually no trauma associated with painful range of motion.  Incomplete relief with Tylenol.  No numbness pain radiates to the forearm but not the hand        Allergies (verified): No Known Drug Allergies  Past History:  Past Surgical History: 1.Cataract extraction 2. Hysterectomy for excessive bleeding but no cancers 3. Laparotomy-exploratory 4. EXCISION OF FACIAL SKIN CANCER 5. BLADDER TUCK 6. Right Knee Arthorscopy - Dr. Romeo Apple - August 2008 - menisceal tear 7. Posterior interbody arthrodesis, L3-4, posterolateral arthrodesis, L3-4, pedicle screw fixation nonsegmental, S3-4, with  Legacy screws.  Under Dr. Damita Dunnings on August 06, 2007 8.  appendix  Family History: Father: Dead Colon Cancer Age unsure Mother: Dead Anuerysm - brain Siblings: Sister Dead at 43 CVA, Living x1 at 53 - Lung Cancer ? FH of Cancer:   Social History: Occupation: Engineer, site, retired Married. Two daughters Never Smoked Alcohol use-no Drug use-no no caffeine use bachelor of arts plus advanced study    Review of Systems Constitutional:  Denies weight loss, weight gain, fever, chills, and fatigue. Cardiovascular:  Denies chest  pain, palpitations, fainting, and murmurs. Respiratory:  Denies short of breath, wheezing, couch, tightness, pain on inspiration, and snoring . Gastrointestinal:  Denies heartburn, nausea, vomiting, diarrhea, constipation, and blood in your stools. Genitourinary:  Denies frequency, urgency, difficulty urinating, painful urination, flank pain, and bleeding in urine. Neurologic:  Denies numbness, tingling, unsteady gait, dizziness, tremors, and seizure. Musculoskeletal:  Denies joint pain, swelling, instability, stiffness, redness, heat, and muscle pain. Endocrine:  Denies excessive thirst, exessive urination, and heat or cold intolerance. Psychiatric:  Denies nervousness, depression, anxiety, and hallucinations. Skin:  Denies changes in the skin, poor healing, rash, itching, and redness. HEENT:  Denies blurred or double vision, eye pain, redness, and watering. Immunology:  Denies seasonal allergies, sinus problems, and allergic to bee stings. Hemoatologic:  Denies easy bleeding and brusing.  Physical Exam  Additional Exam:  GEN: well developed, well nourished, normal grooming and hygiene, no deformity and normal body habitus.   CDV: pulses are normal, no edema, no erythema. no tenderness  Lymph: normal lymph nodes   Skin: no rashes, skin lesions or open sores   NEURO: normal coordination, reflexes, sensation.   Psyche: awake, alert and oriented. Mood normal   Gait: normal heel toe gait  RIGHT shoulder tenderness in the posterior subacromial soft spot in the anterolateral deltoid.  Active range of motion 110 passive 150 with painful arc of motion 110-150 positive impingement sign with Neer and Hawkins maneuver.  Weakness of the supraspinatus tendon.  Shoulder otherwise stable.  LEFT shoulder full range  of motion, no tenderness no swellingj; oint normally reduced muscle tone normal     Impression & Recommendations:  Problem # 1:  IMPINGEMENT SYNDROME (ICD-726.2) Assessment  New  The x-rays were done at [facility]. The report and the films have been reviewed.  No fracture minimal arthritis in the a.c. joint glenohumeral joint with pretty good  We injected the RIGHT shoulder Verbal consent obtained/The right shoulder was injected with depomedrol 40mg /cc and sensorcaine .25% . There were no complications    Orders: New Patient Level III (16109) Joint Aspirate / Injection, Large (20610) Depo- Medrol 40mg  (J1030)  Patient Instructions: 1)  You have bursitis of the shoulder and maybe a small rotator cuff tear  2)  You have received an injection of cortisone today. You may experience increased pain at the injection site. Apply ice pack to the area for 20 minutes every 2 hours and take 2 xtra strength tylenol every 8 hours. This increased pain will usually resolve in 24 hours. The injection will take effect in 3-10 days.  3)  Take the 2 medicines as prescribed  4)  Return in 6 weeks    Orders Added: 1)  New Patient Level III [60454] 2)  Joint Aspirate / Injection, Large [20610] 3)  Depo- Medrol 40mg  [J1030]

## 2010-06-06 NOTE — Letter (Signed)
Summary: History form  History form   Imported By: Jacklynn Ganong 04/24/2010 07:44:13  _____________________________________________________________________  External Attachment:    Type:   Image     Comment:   External Document

## 2010-06-14 ENCOUNTER — Other Ambulatory Visit (HOSPITAL_COMMUNITY): Payer: Self-pay | Admitting: Family Medicine

## 2010-06-14 DIAGNOSIS — Z139 Encounter for screening, unspecified: Secondary | ICD-10-CM

## 2010-06-18 ENCOUNTER — Ambulatory Visit (HOSPITAL_COMMUNITY)
Admission: RE | Admit: 2010-06-18 | Discharge: 2010-06-18 | Disposition: A | Discharge status: Home / Self Care | Payer: Medicare Other | Source: Ambulatory Visit | Attending: Family Medicine | Admitting: Family Medicine

## 2010-06-18 DIAGNOSIS — Z139 Encounter for screening, unspecified: Secondary | ICD-10-CM

## 2010-06-18 DIAGNOSIS — Z1231 Encounter for screening mammogram for malignant neoplasm of breast: Secondary | ICD-10-CM | POA: Insufficient documentation

## 2010-07-10 ENCOUNTER — Encounter: Payer: Self-pay | Admitting: Orthopedic Surgery

## 2010-07-10 ENCOUNTER — Ambulatory Visit (INDEPENDENT_AMBULATORY_CARE_PROVIDER_SITE_OTHER): Payer: Medicare Other | Admitting: Orthopedic Surgery

## 2010-07-16 NOTE — Assessment & Plan Note (Signed)
Summary: reck shoulder/bcbs/bsf   Visit Type:  Follow-up Referring Provider:  Felecia Shelling Primary Provider:  Dr. Mirna Mires  CC:  recheck right shoulder.  History of Present Illness: I saw Kirsten Hogan in the office today for a new problem visit.  She is a 77 years old woman with the complaint of:  right shoulder pain.  No injury.  Xrays [facility] 03/25/10 right shoulder   Meds: Verapamil, Triamterene, Losartan, Pravastatin, Norco 5 did not help.  04/24/11 received injection helped for a while.  Has been having pain for a few weeks.  Has pain in right shoulder that radiates to right hand, no numbness, no neck pain.         Allergies: No Known Drug Allergies  Physical Exam  Additional Exam:   RIGHT shoulder and cervical spine exam.  Cervical spine shows limited range of motion rotating RIGHT and LEFT and negative Spurling sign however.  No tenderness in the cervical spine lymph nodes were normal.  RIGHT shoulder. She had mild weakness and sutures placed. Tendon. The external rotators seem to be normal. Internal rotators and to be normal. In terms of strength here. She had a positive impingement sign. She had some pain around the deltoid, lateral and anterior.  Neurovascular exam LEFT upper extremity. RIGHT upper extremity, normal.     Impression & Recommendations:  Problem # 1:  IMPINGEMENT SYNDROME (ICD-726.2) Assessment Unchanged  Verbal consent obtained/The right shoulder was injected with depomedrol 40mg /cc and sensorcaine .25% . There were no complications   Although she probably has some degenerative disc disease. It is not symptomatic. I don't think at this time. It is mainly her shoulder with some radiation of pain into the upper forearm.  She did request that her therapy done at home versus therapy at the therapist's office  Orders: Est. Patient Level III (40981) Joint Aspirate / Injection, Large (20610) Depo- Medrol 40mg  (J1030)  Medications Added to  Medication List This Visit: 1)  Tylenol With Codeine #3 300-30 Mg Tabs (Acetaminophen-codeine) .Marland Kitchen.. 1 q 4 as needed pan  Patient Instructions: 1)  ROTATOR CUFF DISEASE  2)  PHYSICAL THERAPY at home  3)  You have received an injection of cortisone today. You may experience increased pain at the injection site. Apply ice pack to the area for 20 minutes every 2 hours and take 2 xtra strength tylenol every 8 hours. This increased pain will usually resolve in 24 hours. The injection will take effect in 3-10 days.  4)  Tyelnol # 3 for pain  5)  Please schedule a follow-up appointment in 6 weeks Prescriptions: TYLENOL WITH CODEINE #3 300-30 MG TABS (ACETAMINOPHEN-CODEINE) 1 q 4 as needed pan  #42 x 3   Entered and Authorized by:   Fuller Canada MD   Signed by:   Fuller Canada MD on 07/10/2010   Method used:   Print then Give to Patient   RxID:   [id number]    Orders Added: 1)  Est. Patient Level III [57846] 2)  Joint Aspirate / Injection, Large [20610] 3)  Depo- Medrol 40mg  [J1030]

## 2010-08-21 ENCOUNTER — Ambulatory Visit (INDEPENDENT_AMBULATORY_CARE_PROVIDER_SITE_OTHER): Payer: Medicare Other | Admitting: Orthopedic Surgery

## 2010-08-21 DIAGNOSIS — M25519 Pain in unspecified shoulder: Secondary | ICD-10-CM

## 2010-08-21 DIAGNOSIS — M25511 Pain in right shoulder: Secondary | ICD-10-CM

## 2010-08-21 MED ORDER — METHYLPREDNISOLONE ACETATE 40 MG/ML IJ SUSP: 40.0000 mg | Freq: Once | INTRAMUSCULAR | Status: AC

## 2010-08-21 NOTE — Discharge Summary (Signed)
RIGHT shoulder injection.  Subacromial space injected  Standard injection technique.  Depo-Medrol and lidocaine combination.

## 2010-08-21 NOTE — Patient Instructions (Signed)
You have received a steroid shot. 15% of patients experience increased pain at the injection site with in the next 24 hours. This is best treated with ice and tylenol extra strength 2 tabs every 8 hours. If you are still having pain please call the office.    

## 2010-08-21 NOTE — Progress Notes (Signed)
RIGHT shoulder pain, most likely impingement syndrome with a combination of partial rotator cuff tear versus her side is  Venous injections have done well.  Currently, on a home exercise program.  Does not tolerate Tylenol with codeine.  Requests injection RIGHT subacromial space.  Review of systems occasionally the pain will run down towards the hand, but she does not describe, tingling, or numbness. No neck pain at this time.  Exam shows a well-developed, well-nourished, small framed, female. Painful forward elevation of the RIGHT shoulder with a positive impingement sign. Active elevation 130. Cuff strength 4/5 in flexion, normal, and internal, external rotation. Shoulder is stable. A.c. Joint is nontender. Tenderness is noted in the subacromial area.  Sensation and pulses are normal.  Injection was given.  Stop Tylenol with codeine.  Followup as needed

## 2010-08-21 NOTE — Discharge Summary (Deleted)
LEFT shoulder subacromial space Injection LEFT knee.  Consent was obtained.  Time out was taken   LEFT Shoulderwas injected with Depo-Medrol 40 mg plus lidocaine 1% 4 cc.  Shoulder was prepped with alcohol and anesthetized with ethyl chloride.  The injection was tolerated without complication.   

## 2010-09-17 NOTE — Op Note (Signed)
NAMEDALENE, ROBARDS NO.:  [id number]   MEDICAL RECORD NO.:  [id number]          PATIENT TYPE:  INP   LOCATION:  3172                         FACILITY:  [facility]   PHYSICIAN:  Coletta Memos, M.D.     DATE OF BIRTH:  August 02, 1923   DATE OF PROCEDURE:  08/06/2007  DATE OF DISCHARGE:                               OPERATIVE REPORT   PREOPERATIVE DIAGNOSES:  1. L3-4 lumbar stenosis.  2. L3-4 spondylolisthesis.  3. Grade 1 L3-4 degenerative disk disease.  4. Spondylosis without myelopathy L3-4.   POSTOPERATIVE DIAGNOSES:  1. L3-4 lumbar stenosis.  2. L3-4 spondylolisthesis.  3. Grade 1 L3-4 degenerative disk disease.  4. Spondylosis without myelopathy L3-4.   PROCEDURE:  1. Posterior lumbar interbody arthrodesis L3-4.  2. Posterolateral arthrodesis L3-4 morsellized auto and allograft.  3. Pedicle screw fixation non-segmental L3 to L4.   COMPLICATIONS:  None.   SURGEON:  Coletta Memos, M.D.   ASSISTANT:  Stefani Dama, M.D.   INDICATION:  rivky clendenning is an 77 year old who presented with significant  symptoms consistent with neurogenic claudication.   Dictation ended at this point           ______________________________  Coletta Memos, M.D.     KC/MEDQ  D:  08/06/2007  T:  08/06/2007  Job:  [id number]

## 2010-09-17 NOTE — Op Note (Signed)
[name], [name] NO.:  [id number]   MEDICAL RECORD NO.:  [id number]          PATIENT TYPE:  AMB   LOCATION:  DAY                           FACILITY:  [facility]   PHYSICIAN:  Vickki Hearing, M.D.DATE OF BIRTH:  1923-09-14   DATE OF PROCEDURE:  01/01/2007  DATE OF DISCHARGE:                               OPERATIVE REPORT   PREOPERATIVE DIAGNOSIS:  Torn medial meniscus, right knee.   POSTOPERATIVE DIAGNOSES:  1. Torn medial meniscus, right knee.  2. Torn lateral meniscus, right knee.  3. Patellofemoral osteoarthritis, medial facet and medial trochlear      region.   PROCEDURE:  Arthroscopy, right knee.   OPERATIVE FINDINGS:  The posterior horn of the medial meniscus had an  undersurface tear.  The lateral meniscus had an anterior degenerative  tear.  The patellofemoral joint on the medial side had complete bone-to-  bone changes.   HISTORY:  An 77 year old female with right knee and leg pain, treated  for neurologic symptoms with Neurontin and prednisone, did not improve.  MRI showed torn medial meniscus.  The patient was failing conservative  treatment including topical Voltaren and analgesics and presented for  arthroscopy of the knee.   SURGEON:  Vickki Hearing, M.D.   ANESTHETIC:  Spinal.   PROCEDURE:  Kirsten Hogan was identified as such in  the preop holding area and marked the right knee as the surgical site.  I countersigned it.  I updated her history and physical.  We took her to  surgery.  She had a spinal anesthetic.  She was placed supine on the  operating table.  Antibiotics were started.  Right knee was prepped and  draped with DuraPrep and sterile technique.  Time-out procedure  completed, procedure and patient identity confirmed.   Lateral portal was established.  A diagnostic arthroscopy was performed.  A medial portal was then established and a second diagnostic arthroscopy  was performed with a probe,  using a probe to probe the intra-articular  structures.  We found a torn medial meniscus undersurface posterior  horn.  We found an anterior horn degenerative-type tearing and fraying.  There was laxity, degenerative, of the ACL, nothing pathologic.  The  medial surface of the patellofemoral joint was completely arthritic with  grade 4 changes.   We resected the medial meniscus with a straight duckbill forceps,  balanced the meniscus and removed meniscal fragments with a motorized  shaver.  We then repeated this on the anterior horn.  The patellofemoral  joint medially had no cartilage on the bone and it was left.  The knee  was irrigated, suctioned, injected.   We closed the portals with Steri-Strips, dressings.   We injected around the peroneal nerve with a 40-mg dose of Depo-Medrol.      Vickki Hearing, M.D.  Electronically Signed     [facility]/MEDQ  D:  01/01/2007  T:  01/02/2007  Job:  [id number]

## 2010-09-17 NOTE — Op Note (Signed)
NAMEREAGYN, FACEMIRE NO.:  [id number]   MEDICAL RECORD NO.:  [id number]          PATIENT TYPE:  INP   LOCATION:  3014                         FACILITY:  [facility]   PHYSICIAN:  Coletta Memos, M.D.     DATE OF BIRTH:  [birth year]   DATE OF PROCEDURE:  DATE OF DISCHARGE:                               OPERATIVE REPORT   CONTINUATION   Kirsten Hogan was brought to the operating room, intubated and placed under  general anesthetic.  She had a Foley catheter placed under sterile  conditions.  She was then rolled prone onto a Jackson table and all  pressure points were properly padded.  Her back was prepped and she was  draped in a sterile fashion.  I infiltrated 20 mL of 0.5% lidocaine with  1:200,000 strength epinephrine into the lumbar region and paraspinous  musculature.  I opened the skin with a #10 blade and I took this down to  the thoracolumbar fascia.  I then exposed the lamina of what turned out  to be L1, L2, L3 and L4.  I was able localize finally and had this read  by the radiologist.  Once that was done, I proceeded with the  decompression.   I started decompressing the nerve roots first by performing semi-  hemilaminectomies using a high-speed drill on both sides.  I also used a  Kerrison punch to remove both bone and ligament.  She was quite tight at  the L3-4 level as was expected from the MRI findings.  I was able to  then expose the disk spaces on both sides.  I controlled epidural  bleeding with cautery.  I eventually performed a diskectomy at the L3-4  level to prepare for the posterior lumbar interbody arthrodesis.  I used  a variety of tools: Epstein curettes, shavers, and other implements to  remove the disk material, the end plates and to prepare a fresh bony  surface for the arthrodesis.  I use morselized autograft and infused it  in the disk space.  I then placed 2 cages packed with morselized  autograft, 13 mm opal, into the disk space on both  sides.  After that  was complete, an x-ray showed that the cages were in good position.  I  then turned my attention to the posterolateral arthrodesis.  I had  previously exposed at the beginning of the case, the transverse  processes of L3 and L4.  I decorticated bilaterally.  I then placed bone  graft out over those areas for the posterolateral arthrodesis.  I  essentially simultaneously also placed the pedicle screws.   Pedicle screws were placed; 2 in L3, 1 on the right, 1 on left; 2 in L4,  1on the right and 1 on the left, with fluoroscopic guidance.  I first  used a high-speed drill to create an entry site for the pedicle probe.  I placed the pedicle probe through the pedicle.  I then used a sounder  to make sure that there were no cutouts.  I then placed screws after  using a 5.5-mm  tap.  I placed two 65 x 40-mm screws at L4, two 65 x 45  at L3.  I connected the screws to the rods.  I locked the caps.  I  locked the rods to the screw heads.  X-ray showed the screws and the  rods to be in good position, as were the interbody implants.  And having  now achieved nonsegmental pedicle screw fixation, I irrigated.  I placed  more InFuse posterolaterally also, along with morselized allograft and  autograft using V ties.  I then closed the wound in layered fashion  using Vicryl sutures.  I reapproximated the thoracolumbar subcutaneous  and subcuticular layers.  Dermabond was used for a sterile dressing.           ______________________________  Coletta Memos, M.D.     KC/MEDQ  D:  08/06/2007  T:  08/06/2007  Job:  [id number]

## 2010-09-17 NOTE — Discharge Summary (Signed)
NAMENONIE, LOCHNER NO.:  [id number]   MEDICAL RECORD NO.:  [id number]          PATIENT TYPE:  INP   LOCATION:  3014                         FACILITY:  [facility]   PHYSICIAN:  Coletta Memos, M.D.     DATE OF BIRTH:  07-Jun-1923   DATE OF ADMISSION:  08/06/2007  DATE OF DISCHARGE:  08/13/2007                               DISCHARGE SUMMARY   ADMITTING DIAGNOSIS:  1. Lumbar stenosis, L3-4.  2. Lumbar spondylolisthesis, L3-4.  3. Degenerative disk disease.   COMPLICATIONS:  None.   PROCEDURE:  Posterior interbody arthrodesis, L3-4, posterolateral  arthrodesis, L3-4, pedicle screw fixation nonsegmental, S3-4, with  Legacy screws.   DISCHARGE STATUS:  Alive and well.   MEDICATIONS WILL BE:  Percocet for pain, Flexeril for muscle spasms.   The patient is walking, tolerating a regular diet, and voiding without  difficulty at discharge.  She has been cleared by physical therapy.  Her  hospitalization has been uneventful outside of too much pain medication  given on August 08, 2007 which led to increased lethargy.  Once that was  withdrawn, she has done just fine.  Wound clean and dry with no signs of  infection at discharge.  She is ambulating well.   Her back pain has improved as her leg pain but she still has significant  operative back pain.  I will see her back in the office in 3-4 weeks.           ______________________________  Coletta Memos, M.D.     KC/MEDQ  D:  08/12/2007  T:  08/13/2007  Job:  [id number]

## 2010-09-20 NOTE — H&P (Signed)
[facility]  Patient:    DELAYLA, HOFFMASTER Visit Number: [id number] MRN: [id number]          Service Type: Attending:  Elpidio Anis, M.D. Dictated by:   Elpidio Anis, M.D.                           History and Physical  HISTORY OF PRESENT ILLNESS:  [age]-year-old female referred for scanning colonoscopy.  She has normal bowel habits.  She denies abdominal pain.  This has been arranged due to rectal bleeding or guaiac-positive stool.  PAST HISTORY:  She has hypertension and anxiety disorder.  MEDICATIONS: 1. Diovan 60 mg q.d. 2. Maxzide 25 mg q.d. 3. Verelan 200 mg q.d. 4. Aspirin 325 mg q.d.  PAST SURGICAL HISTORY:  Total abdominal hysterectomy and bilateral salpingo-oophorectomy.  PHYSICAL EXAMINATION:  VITAL SIGNS:  Blood pressure 142/90, pulse 60, respirations 18.  Weight 176 pounds.  HEENT:  No abnormalities noted.  NECK:  Neck is supple without JVD or bruit.  CHEST:  Clear to auscultation.  No rales, rubs, rhonchi or wheezes.  HEART:  Regular rate and rhythm without any murmur, gallop or rub.  ABDOMEN:  Soft, nontender.  No masses.  EXTREMITIES:  No cyanosis, clubbing or edema.  NEUROLOGIC:  No focal motor, sensory or cerebellar deficit.  Cranial nerves intact.  IMPRESSION: 1. Need for screening colonoscopy. 2. Hypertension. 3. Anxiety disorder.  PLAN:  Screening colonoscopy. Dictated by:   Elpidio Anis, M.D. Attending:  Elpidio Anis, M.D. DD:  10/20/01 TD:  10/20/01 Job: [id number]

## 2010-09-20 NOTE — H&P (Signed)
Kirsten Hogan, Kirsten Hogan                  ACCOUNT NO.:  [id number]   MEDICAL RECORD NO.:  [id number]          PATIENT TYPE:  AMB   LOCATION:  DAY                           FACILITY:  [facility]   PHYSICIAN:  Jerolyn Shin C. Katrinka Blazing, M.D.   DATE OF BIRTH:  08/15/1923   DATE OF ADMISSION:  DATE OF DISCHARGE:  LH                                HISTORY & PHYSICAL   A 77 year old female with a history of guaiac positive stool, rectal  bleeding after bowel movements.  She states that she sees blood on the  tissue but not on the stool.  She denies abdominal pain.  She has had  problems with increasing constipation.  The patient is scheduled for  colonoscopy.  She did have a screening colonoscopy in 2003.  She has not  been anemic.   PAST HISTORY:  1.  Hypertension.  2.  Osteoarthritis.  3.  Glaucoma.  4.  Constipation.   SURGERY:  1.  Hysterectomy.  2.  Excision of a facial skin cancer.   MEDICATIONS:  1.  Metamucil.  2.  Verapamil 240 daily.  3.  Triamterene hydrochlorothiazide 37.5/25 daily.  4.  Benicar 40 mg daily.  5.  Lipitor 10 mg at bedtime.  6.  Klor-Con 10 mEq daily.   PHYSICAL EXAMINATION:  VITAL SIGNS:  Blood pressure 140/82, pulse 84,  respirations 20, weight 163 pounds.  HEENT:  Unremarkable.  She has a well healed right facial incision.  NECK:  Supple.  No JVD, bruit, adenopathy, or thyromegaly.  CHEST:  Clear to auscultation.  HEART:  Regular rate and rhythm without murmur, gallop, or rub.  ABDOMEN:  Soft, nontender.  No masses.  EXTREMITIES:  No cyanosis, clubbing, or edema.  NEUROLOGIC:  No focal motor, sensory, or cerebellar deficit.   IMPRESSION:  1.  Rectal bleeding with guaiac positive stool.  2.  Constipation.  3.  Hypertension.  4.  Hyperlipidemia.   PLAN:  Colonoscopy for evaluation of rectal bleeding.      Dirk Dress. Katrinka Blazing, M.D.  Electronically Signed     LCS/MEDQ  D:  06/18/2005  T:  06/18/2005  Job:  [id number]

## 2011-01-28 LAB — CBC
HCT: 26.3 — ABNORMAL LOW
HCT: 29.5 — ABNORMAL LOW
HCT: 33 — ABNORMAL LOW
Hemoglobin: 8.9 — ABNORMAL LOW
Hemoglobin: 9.9 — ABNORMAL LOW
MCHC: 33
MCHC: 33.4
MCHC: 33.9
MCV: 87.6
MCV: 88.5
Platelets: 138 — ABNORMAL LOW
Platelets: 210
RBC: 3.73 — ABNORMAL LOW
RBC: 4.46
RDW: 13.5
RDW: 13.8
WBC: 17.7 — ABNORMAL HIGH
WBC: 5.4

## 2011-01-28 LAB — BASIC METABOLIC PANEL
BUN: 10
Calcium: 10.3
Chloride: 104
Creatinine, Ser: 0.8
GFR calc Af Amer: >60
GFR calc non Af Amer: >60
Glucose, Bld: 133 — ABNORMAL HIGH
Potassium: 3.3 — ABNORMAL LOW
Sodium: 142

## 2011-01-28 LAB — COMPREHENSIVE METABOLIC PANEL
BUN: 5 — ABNORMAL LOW
CO2: 26
Calcium: 8.2 — ABNORMAL LOW
Chloride: 94 — ABNORMAL LOW
Creatinine, Ser: 0.76
GFR calc Af Amer: >60
GFR calc non Af Amer: >60
Total Bilirubin: 0.8

## 2011-01-28 LAB — URINE CULTURE
Colony Count: NO GROWTH
Culture: NO GROWTH
Special Requests: NEGATIVE

## 2011-01-28 LAB — POCT I-STAT 4, (NA,K, GLUC, HGB,HCT)
Glucose, Bld: 145 — ABNORMAL HIGH
Operator id: 151301
Potassium: 3.1 — ABNORMAL LOW

## 2011-01-28 LAB — URINALYSIS, ROUTINE W REFLEX MICROSCOPIC
Bilirubin Urine: NEGATIVE
Glucose, UA: NEGATIVE
Ketones, ur: NEGATIVE
Protein, ur: 30 — AB
Urobilinogen, UA: 1

## 2011-01-28 LAB — TYPE AND SCREEN
ABO/RH(D): A POS
Antibody Screen: NEGATIVE

## 2011-01-28 LAB — URINE MICROSCOPIC-ADD ON

## 2011-01-28 LAB — ABO/RH: ABO/RH(D): A POS

## 2011-02-14 LAB — BASIC METABOLIC PANEL
BUN: 9
Calcium: 9.1
GFR calc non Af Amer: >60
Glucose, Bld: 88
Sodium: 138

## 2011-05-15 ENCOUNTER — Other Ambulatory Visit (HOSPITAL_COMMUNITY): Payer: Self-pay | Admitting: Family Medicine

## 2011-05-15 ENCOUNTER — Ambulatory Visit (HOSPITAL_COMMUNITY)
Admission: RE | Admit: 2011-05-15 | Discharge: 2011-05-15 | Disposition: A | Discharge status: Home / Self Care | Payer: Medicare Other | Source: Ambulatory Visit | Attending: Family Medicine | Admitting: Family Medicine

## 2011-05-15 DIAGNOSIS — M545 Low back pain, unspecified: Secondary | ICD-10-CM | POA: Insufficient documentation

## 2011-05-15 DIAGNOSIS — M533 Sacrococcygeal disorders, not elsewhere classified: Secondary | ICD-10-CM | POA: Insufficient documentation

## 2011-05-15 DIAGNOSIS — R52 Pain, unspecified: Secondary | ICD-10-CM

## 2011-05-21 ENCOUNTER — Other Ambulatory Visit (HOSPITAL_COMMUNITY): Payer: Self-pay | Admitting: Family Medicine

## 2011-05-21 DIAGNOSIS — Z139 Encounter for screening, unspecified: Secondary | ICD-10-CM

## 2011-06-20 ENCOUNTER — Ambulatory Visit (HOSPITAL_COMMUNITY)
Admission: RE | Admit: 2011-06-20 | Discharge: 2011-06-20 | Disposition: A | Discharge status: Home / Self Care | Payer: Medicare Other | Source: Ambulatory Visit | Attending: Family Medicine | Admitting: Family Medicine

## 2011-06-20 DIAGNOSIS — Z1231 Encounter for screening mammogram for malignant neoplasm of breast: Secondary | ICD-10-CM | POA: Insufficient documentation

## 2011-06-20 DIAGNOSIS — Z139 Encounter for screening, unspecified: Secondary | ICD-10-CM

## 2011-09-15 ENCOUNTER — Ambulatory Visit (HOSPITAL_COMMUNITY)
Admission: RE | Admit: 2011-09-15 | Discharge: 2011-09-15 | Disposition: A | Discharge status: Home / Self Care | Payer: Medicare Other | Source: Ambulatory Visit | Attending: Family Medicine | Admitting: Family Medicine

## 2011-09-15 ENCOUNTER — Other Ambulatory Visit (HOSPITAL_COMMUNITY): Payer: Self-pay | Admitting: Family Medicine

## 2011-09-15 DIAGNOSIS — R52 Pain, unspecified: Secondary | ICD-10-CM

## 2011-09-15 DIAGNOSIS — M25519 Pain in unspecified shoulder: Secondary | ICD-10-CM | POA: Insufficient documentation

## 2011-10-08 ENCOUNTER — Ambulatory Visit (INDEPENDENT_AMBULATORY_CARE_PROVIDER_SITE_OTHER): Payer: Medicare Other | Admitting: Orthopedic Surgery

## 2011-10-08 ENCOUNTER — Encounter: Payer: Self-pay | Admitting: Orthopedic Surgery

## 2011-10-08 VITALS — BP 120/80 | Ht 66.0 in | Wt 155.0 lb

## 2011-10-08 DIAGNOSIS — M25519 Pain in unspecified shoulder: Secondary | ICD-10-CM | POA: Insufficient documentation

## 2011-10-08 NOTE — Patient Instructions (Signed)
Activities as tolerated. 

## 2011-10-08 NOTE — Progress Notes (Signed)
Patient ID: Kirsten Hogan, female   DOB: Sep 14, 1923, 78 y.o.   MRN: [id number] Chief Complaint  Patient presents with  . Follow-up    Right shoulder pain.   BP 120/80  Ht 5\' 6"  (1.676 m)  Wt 155 lb (70.308 kg)  BMI 25.02 kg/m2  The patient came home a week or so ago with acute onset of shoulder pain and right pain radiating into her neck with decreased range of motion of the right shoulder however with some ice and rest the pain has gone away  Today she has full range of motion and a negative impingement sign  Impression acute bursitis resolved  Plan activities as tolerated

## 2011-12-14 ENCOUNTER — Encounter: Payer: Self-pay | Admitting: Orthopedic Surgery

## 2011-12-14 NOTE — Progress Notes (Unsigned)
Patient ID: Kirsten Hogan, female   DOB: Aug 16, 1923, 78 y.o.   MRN: [id number] Called by daughter  summary of history Pain right shoulder/ now 2 years starting in 2011  Pain with forward elevation radiates to elbow with some occasional radiation to neck  Previous treatment: activity modification, inject subacromial space x 2, physical therapy with therabands, nsaid advil, tylenol, tylenol with codeine which caused upset stomach  Differential diagnosis:  worsening bursitis, impingement syndrome  rotator cuff tear partial vs complete  cervical ddd  PLAN MRI RT SHOULDER

## 2011-12-15 ENCOUNTER — Telehealth: Payer: Self-pay | Admitting: Radiology

## 2011-12-16 ENCOUNTER — Other Ambulatory Visit: Payer: Self-pay | Admitting: Radiology

## 2011-12-16 DIAGNOSIS — M75101 Unspecified rotator cuff tear or rupture of right shoulder, not specified as traumatic: Secondary | ICD-10-CM

## 2011-12-17 ENCOUNTER — Encounter: Payer: Self-pay | Admitting: Orthopedic Surgery

## 2011-12-17 ENCOUNTER — Ambulatory Visit (INDEPENDENT_AMBULATORY_CARE_PROVIDER_SITE_OTHER): Payer: Medicare Other | Admitting: Orthopedic Surgery

## 2011-12-17 VITALS — Ht 66.0 in | Wt 155.0 lb

## 2011-12-17 DIAGNOSIS — M542 Cervicalgia: Secondary | ICD-10-CM

## 2011-12-17 MED ORDER — PREDNISONE 5 MG PO TABS: 5.0000 mg | ORAL_TABLET | Freq: Two times a day (BID) | ORAL | Status: AC

## 2011-12-17 MED ORDER — HYDROCODONE-ACETAMINOPHEN 5-325 MG PO TABS: 1.0000 | ORAL_TABLET | Freq: Four times a day (QID) | ORAL | Status: AC | PRN

## 2011-12-17 NOTE — Progress Notes (Signed)
Patient ID: Kirsten Hogan, female   DOB: 07-Feb-1924, 78 y.o.   MRN: [id number]   Right shoulder pain and cervical pain  The patient has pain in the back of her cervical spine and also pain in her right shoulder. Right shoulder pain seems to come and go. She has stiffness in the shoulder and painful forward elevation weakness.  She also tender over the posterior cervical spine at the C6 junction.  Started her on a prednisone tablet 10 mg split in 2 tablets daily  Start Norco 5 mg every 6 when necessary pain  She has an MRI scheduled for Friday with a followup scheduled for next week  We were unable to get approval for MRI of the cervical spine.

## 2011-12-17 NOTE — Patient Instructions (Addendum)
Please take pain medication as needed until we can get your MRI completed. We will discuss the findings and further treatment options on your return  I have included some information regarding surgical treatment if that becomes necessary.  Rotator Cuff Tear The rotator cuff is four tendons that assist in the motion of the shoulder. A rotator cuff tear is a tear in one of these four tendons. It is characterized by pain and weakness of the shoulder. The rotator cuff tendons surround the shoulder ball and socket joint (humeral head). The rotator cuff tendons attach to the shoulder blade (scapula) on one side and the upper arm bone (humerus) on the other side. The rotator cuff is essential for shoulder stability and shoulder motion. SYMPTOMS    Pain around the shoulder, often at the outer portion of the upper arm.   Pain that is worse with shoulder function, especially when reaching overhead or lifting.   Weakness of the shoulder muscles.   Aching when not using your arm; often, pain awakens you at night, especially when sleeping on the affected side.   Tenderness, swelling, warmth, or redness over the outer aspect of the shoulder.   Loss of strength.   Limited motion of the shoulder, especially reaching behind (reaching into one's back pocket) or across your body.   A crackling sound (crepitation) when moving the shoulder.   Biceps tendon pain (in the front of the shoulder) and inflammation, worse with bending the elbow or lifting.  CAUSES    Strain from sudden increase in amount or intensity of activity.   Direct blow or injury to the shoulder.   Aging, wear from from normal use.   Roof of the shoulder (acromial) spur.  RISK INCREASES WITH:    Contact sports (football, wrestling, or boxing).   Throwing or hitting sports (baseball, tennis, or volleyball).   Weightlifting and bodybuilding.   Heavy labor.   Previous injury to rotator cuff.   Failure to warm up properly before  activity.   Inadequate protective equipment.   Increasing age.   Spurring of the outer end of the scapula (acromion).   Cortisone injections.   Poor shoulder strength and flexibility.  PREVENTION  Warm up and stretch properly before activity.   Allow time for rest and recovery between practices and competition.   Maintain physical fitness:   Cardiovascular fitness.   Shoulder flexibility.   Strength and endurance of the rotator cuff muscles and muscles of the shoulder blade.   Learn and use proper technique when throwing or hitting.  PROGNOSIS Surgery is often needed. Although, symptoms may go away by themselves. RELATED COMPLICATIONS    Persistent pain that may progress to constant pain.   Shoulder stiffness, frozen shoulder syndrome, or loss of motion.   Recurrence of symptoms, especially if treated without surgery.   Inability to return to same level of sports, even with surgery.   Persistent weakness.   Risks of surgery, including infection, bleeding, injury to nerves, shoulder stiffness, weakness, re-tearing of the rotator cuff tendon.   Deltoid detachment, acromial fracture, and persistent pain.  TREATMENT Treatment involves the use of ice and medicine to reduce pain and inflammation. Strengthening and stretching exercise are usually recommended. These exercises may be completed at home or with a therapist. You may also be instructed to modify offending activities. Corticosteroid injections may be given to reduce inflammation. Surgery is usually recommended for athletes. Surgery has the best chance for a full recovery. Surgery involves:  Removal of an  inflamed bursa.   Removal of an acromial spur if present.   Suturing the torn tendon back together.  Rotator cuff surgeries may be preformed either arthroscopically or through an open incision. Recovery typically takes 6 to 12 months. MEDICATION  If pain medicine is necessary, then nonsteroidal  anti-inflammatory medicines, such as aspirin and ibuprofen, or other minor pain relievers, such as acetaminophen, are often recommended.   Do not take pain medicine for 7 days before surgery.   Prescription pain relievers are usually only prescribed after surgery. Use only as directed and only as much as you need.   Corticosteroid injections may be given to reduce inflammation. However, there is a limited number of times the joint may be injected with these medicines.  HEAT AND COLD  Cold treatment (icing) relieves pain and reduces inflammation. Cold treatment should be applied for 10 to 15 minutes every 2 to 3 hours for inflammation and pain and immediately after any activity that aggravates your symptoms. Use ice packs or massage the area with a piece of ice (ice massage).   Heat treatment may be used prior to performing the stretching and strengthening activities prescribed by your caregiver, physical therapist, or athletic trainer. Use a heat pack or soak the injury in warm water.  SEEK MEDICAL CARE IF:    Symptoms get worse or do not improve in 4 to 6 weeks despite treatment.   You experience pain, numbness, or coldness in the hand.   Blue, gray, or dark color appears in the fingernails.   New, unexplained symptoms develop (drugs used in treatment may produce side effects).  Document Released: 04/21/2005 Document Revised: 04/10/2011 Document Reviewed: 08/03/2008 [facility] Patient Information 2012 [place], [place].  Surgery for Rotator Cuff Tear with Rehab Rotator cuff surgery is only recommended for individuals who have experienced persistent disability for greater than 3 months of non-surgical (conservative) treatment. Surgery is not necessary but is recommended for individuals who experience difficulty completing daily activities or athletes who are unable to compete. Rotator cuff tears do not usually heal without surgical intervention. If left alone small rotator cuff tears usually  become larger. Younger athletes who have a rotator cuff tear may be recommended for surgery without attempting conservative rehabilitation. The purpose of surgery is to regain function of the shoulder joint and eliminate pain associated with the injury. In addition to repairing the tendon tear, the surgery often removes a portion of the bony roof of the shoulder (acromion) as well as the chronically thickened and inflamed membrane below the acromion (subacromial bursa). REASONS NOT TO OPERATE    Infection of the shoulder.   Inability to complete a rehabilitation program.   Patients who have other conditions (emotional or psychological) conditions that contribute to their shoulder condition.  RISKS AND COMPLICATIONS  Infection.   Re-tear of the rotator cuff tendons or muscles.   Shoulder stiffness and/or weakness.   Inability to compete in athletics.   Acromioclavicular (AC) joint paint.   Risks of surgery: infection, bleeding, nerve damage, or damage to surrounding tissues.  TECHNIQUE There are different surgical procedures used to treat rotator cuff tears. The type of procedure depends on the extent of injury as well as the surgeon's preference. All of the surgical techniques for rotator cuff tears have the same goal of repairing the torn tendon, removing part of the acromion, and removing the subacromial bursa. There are two main types of procedures: arthroscopic and open incision. Arthroscopic procedures are usually completed and you go home the same  day as surgery (outpatient). These procedures use multiple small incisions in which tools and a video camera are placed to work on the shoulder. An electric shaver removes the bursa, then a power burr shaves down the portion of the acromion that places pressure on the rotator cuff. Finally the rotator cuff is sewed (sutured) back to the humeral head. Open incision procedures require a larger incision. The deltoid muscle is detached from the  acromion and a ligament in the shoulder (coracoacromial) is cut in order for the surgeon to access the rotator cuff. The subacromial bursa is removed as well as part of the acromion to give the rotator cuff room to move freely. The torn tendon is then sutured to the humeral head. After the rotator cuff is repaired, then the deltoid is reattached and the incision is closed up.   RECOVERY    Post-operative care depends on the surgical technique and the preferences of your therapist.   Keep the wound clean and dry for the first 10 to 14 days after surgery.   Keep your shoulder and arm in the sling provided to you for as long as you have been instructed to.   You will be given pain medications by your caregiver.   Passive (without using muscles) shoulder movements may be begun immediately after surgery.   It is important to follow through with you rehabilitation program in order to have the best possible recovery.  RETURN TO SPORTS    The rehabilitation period will depend on the sport and position you play as well as the success of the operation.   The minimum recovery period is 6 months.   You must have regained complete shoulder motion and strength before returning to sports.  SEEK IMMEDIATE MEDICAL CARE IF:    Any medications produce adverse side effects.   Any complications from surgery occur:   Pain, numbness, or coldness in the extremity operated upon.   Discoloration of the nail beds (they become blue or gray) of the extremity operated upon.   Signs of infections (fever, pain, inflammation, redness, or persistent bleeding).    Surgery for Rotator Cuff Tear with Rehab Rotator cuff surgery is only recommended for individuals who have experienced persistent disability for greater than 3 months of non-surgical (conservative) treatment. Surgery is not necessary but is recommended for individuals who experience difficulty completing daily activities or athletes who are unable to  compete. Rotator cuff tears do not usually heal without surgical intervention. If left alone small rotator cuff tears usually become larger. Younger athletes who have a rotator cuff tear may be recommended for surgery without attempting conservative rehabilitation. The purpose of surgery is to regain function of the shoulder joint and eliminate pain associated with the injury. In addition to repairing the tendon tear, the surgery often removes a portion of the bony roof of the shoulder (acromion) as well as the chronically thickened and inflamed membrane below the acromion (subacromial bursa). REASONS NOT TO OPERATE    Infection of the shoulder.   Inability to complete a rehabilitation program.   Patients who have other conditions (emotional or psychological) conditions that contribute to their shoulder condition.  RISKS AND COMPLICATIONS  Infection.   Re-tear of the rotator cuff tendons or muscles.   Shoulder stiffness and/or weakness.   Inability to compete in athletics.   Acromioclavicular (AC) joint paint.   Risks of surgery: infection, bleeding, nerve damage, or damage to surrounding tissues.  TECHNIQUE There are different surgical procedures used to treat  rotator cuff tears. The type of procedure depends on the extent of injury as well as the surgeon's preference. All of the surgical techniques for rotator cuff tears have the same goal of repairing the torn tendon, removing part of the acromion, and removing the subacromial bursa. There are two main types of procedures: arthroscopic and open incision. Arthroscopic procedures are usually completed and you go home the same day as surgery (outpatient). These procedures use multiple small incisions in which tools and a video camera are placed to work on the shoulder. An electric shaver removes the bursa, then a power burr shaves down the portion of the acromion that places pressure on the rotator cuff. Finally the rotator cuff is sewed  (sutured) back to the humeral head. Open incision procedures require a larger incision. The deltoid muscle is detached from the acromion and a ligament in the shoulder (coracoacromial) is cut in order for the surgeon to access the rotator cuff. The subacromial bursa is removed as well as part of the acromion to give the rotator cuff room to move freely. The torn tendon is then sutured to the humeral head. After the rotator cuff is repaired, then the deltoid is reattached and the incision is closed up.   RECOVERY    Post-operative care depends on the surgical technique and the preferences of your therapist.   Keep the wound clean and dry for the first 10 to 14 days after surgery.   Keep your shoulder and arm in the sling provided to you for as long as you have been instructed to.   You will be given pain medications by your caregiver.   Passive (without using muscles) shoulder movements may be begun immediately after surgery.   It is important to follow through with you rehabilitation program in order to have the best possible recovery.

## 2011-12-18 ENCOUNTER — Ambulatory Visit: Payer: Medicare Other | Admitting: Orthopedic Surgery

## 2011-12-19 ENCOUNTER — Ambulatory Visit (HOSPITAL_COMMUNITY)
Admission: RE | Admit: 2011-12-19 | Discharge: 2011-12-19 | Disposition: A | Discharge status: Home / Self Care | Payer: Medicare Other | Source: Ambulatory Visit | Attending: Orthopedic Surgery | Admitting: Orthopedic Surgery

## 2011-12-19 DIAGNOSIS — M25519 Pain in unspecified shoulder: Secondary | ICD-10-CM | POA: Insufficient documentation

## 2011-12-19 DIAGNOSIS — X58XXXA Exposure to other specified factors, initial encounter: Secondary | ICD-10-CM | POA: Insufficient documentation

## 2011-12-19 DIAGNOSIS — M75101 Unspecified rotator cuff tear or rupture of right shoulder, not specified as traumatic: Secondary | ICD-10-CM

## 2011-12-19 DIAGNOSIS — M542 Cervicalgia: Secondary | ICD-10-CM | POA: Insufficient documentation

## 2011-12-19 DIAGNOSIS — S46819A Strain of other muscles, fascia and tendons at shoulder and upper arm level, unspecified arm, initial encounter: Secondary | ICD-10-CM | POA: Insufficient documentation

## 2011-12-19 DIAGNOSIS — M79609 Pain in unspecified limb: Secondary | ICD-10-CM | POA: Insufficient documentation

## 2011-12-23 NOTE — Telephone Encounter (Signed)
Patient has MRI at [facility] on 12-19-11. She has Medicare and BCBS secondary. BCBS authorization #[id number].

## 2011-12-24 ENCOUNTER — Encounter: Payer: Self-pay | Admitting: Orthopedic Surgery

## 2011-12-24 ENCOUNTER — Ambulatory Visit (INDEPENDENT_AMBULATORY_CARE_PROVIDER_SITE_OTHER): Payer: Medicare Other | Admitting: Orthopedic Surgery

## 2011-12-24 VITALS — BP 130/70 | Ht 66.0 in | Wt 155.0 lb

## 2011-12-24 DIAGNOSIS — S43429A Sprain of unspecified rotator cuff capsule, initial encounter: Secondary | ICD-10-CM

## 2011-12-24 DIAGNOSIS — M75101 Unspecified rotator cuff tear or rupture of right shoulder, not specified as traumatic: Secondary | ICD-10-CM | POA: Insufficient documentation

## 2011-12-24 NOTE — Progress Notes (Signed)
Patient ID: Kirsten Hogan, female   DOB: October 16, 1923, 78 y.o.   MRN: [id number] Chief Complaint  Patient presents with  . Results    MRI review of Right shoulder, not having pain     BP 130/70  Ht 5\' 6"  (1.676 m)  Wt 155 lb (70.308 kg)  BMI 25.02 kg/m2   Followup MRI results right shoulder pain although currently not hurting. Did not tolerate hydrocodone, and does not tolerate codeine. Again not hurting now. After review of the MRI and the report I agree that there is a torn rotator cuff with mild retraction. No atrophy no fatty infiltration. I recommended surgery to repair the cuff if the patient continued to have pain or loss of function. She says she is not.  She said she would not like any other pain medicines at this time secondary to nausea  Followup with [place] as needed.

## 2011-12-24 NOTE — Patient Instructions (Addendum)
Rotator Cuff Tear The rotator cuff is four tendons that assist in the motion of the shoulder. A rotator cuff tear is a tear in one of these four tendons. It is characterized by pain and weakness of the shoulder. The rotator cuff tendons surround the shoulder ball and socket joint (humeral head). The rotator cuff tendons attach to the shoulder blade (scapula) on one side and the upper arm bone (humerus) on the other side. The rotator cuff is essential for shoulder stability and shoulder motion. SYMPTOMS    Pain around the shoulder, often at the outer portion of the upper arm.   Pain that is worse with shoulder function, especially when reaching overhead or lifting.   Weakness of the shoulder muscles.   Aching when not using your arm; often, pain awakens you at night, especially when sleeping on the affected side.   Tenderness, swelling, warmth, or redness over the outer aspect of the shoulder.   Loss of strength.   Limited motion of the shoulder, especially reaching behind (reaching into one's back pocket) or across your body.   A crackling sound (crepitation) when moving the shoulder.   Biceps tendon pain (in the front of the shoulder) and inflammation, worse with bending the elbow or lifting.  CAUSES    Strain from sudden increase in amount or intensity of activity.   Direct blow or injury to the shoulder.   Aging, wear from from normal use.   Roof of the shoulder (acromial) spur.  RISK INCREASES WITH:    Contact sports (football, wrestling, or boxing).   Throwing or hitting sports (baseball, tennis, or volleyball).   Weightlifting and bodybuilding.   Heavy labor.   Previous injury to rotator cuff.   Failure to warm up properly before activity.   Inadequate protective equipment.   Increasing age.   Spurring of the outer end of the scapula (acromion).   Cortisone injections.   Poor shoulder strength and flexibility.  PREVENTION  Warm up and stretch properly  before activity.   Allow time for rest and recovery between practices and competition.   Maintain physical fitness:   Cardiovascular fitness.   Shoulder flexibility.   Strength and endurance of the rotator cuff muscles and muscles of the shoulder blade.   Learn and use proper technique when throwing or hitting.  PROGNOSIS Surgery is often needed. Although, symptoms may go away by themselves. RELATED COMPLICATIONS    Persistent pain that may progress to constant pain.   Shoulder stiffness, frozen shoulder syndrome, or loss of motion.   Recurrence of symptoms, especially if treated without surgery.   Inability to return to same level of sports, even with surgery.   Persistent weakness.   Risks of surgery, including infection, bleeding, injury to nerves, shoulder stiffness, weakness, re-tearing of the rotator cuff tendon.   Deltoid detachment, acromial fracture, and persistent pain.  TREATMENT Treatment involves the use of ice and medicine to reduce pain and inflammation. Strengthening and stretching exercise are usually recommended. These exercises may be completed at home or with a therapist. You may also be instructed to modify offending activities. Corticosteroid injections may be given to reduce inflammation. Surgery is usually recommended for athletes. Surgery has the best chance for a full recovery. Surgery involves:  Removal of an inflamed bursa.   Removal of an acromial spur if present.   Suturing the torn tendon back together.  Rotator cuff surgeries may be preformed either arthroscopically or through an open incision. Recovery typically takes 6 to 12  months. MEDICATION  If pain medicine is necessary, then nonsteroidal anti-inflammatory medicines, such as aspirin and ibuprofen, or other minor pain relievers, such as acetaminophen, are often recommended.   Do not take pain medicine for 7 days before surgery.   Prescription pain relievers are usually only prescribed  after surgery. Use only as directed and only as much as you need.   Corticosteroid injections may be given to reduce inflammation. However, there is a limited number of times the joint may be injected with these medicines.  HEAT AND COLD  Cold treatment (icing) relieves pain and reduces inflammation. Cold treatment should be applied for 10 to 15 minutes every 2 to 3 hours for inflammation and pain and immediately after any activity that aggravates your symptoms. Use ice packs or massage the area with a piece of ice (ice massage).   Heat treatment may be used prior to performing the stretching and strengthening activities prescribed by your caregiver, physical therapist, or athletic trainer. Use a heat pack or soak the injury in warm water.  SEEK MEDICAL CARE IF:    Symptoms get worse or do not improve in 4 to 6 weeks despite treatment.   You experience pain, numbness, or coldness in the hand.   Blue, gray, or dark color appears in the fingernails.   New, unexplained symptoms develop (drugs used in treatment may produce side effects).  Document Released: 04/21/2005 Document Revised: 04/10/2011 Document Reviewed: 08/03/2008 [facility] Patient Information 2012 [place], [place].

## 2012-01-06 ENCOUNTER — Telehealth: Payer: Self-pay | Admitting: Orthopedic Surgery

## 2012-01-06 NOTE — Telephone Encounter (Signed)
Lorane Gell daughter, Lorenna Lurry asked if you will call her at [phone].  She wants to talk with you about shoulder surgery for her mother

## 2012-01-07 ENCOUNTER — Encounter: Payer: Self-pay | Admitting: Orthopedic Surgery

## 2012-01-26 ENCOUNTER — Ambulatory Visit: Payer: Medicare Other | Admitting: Cardiology

## 2012-01-27 NOTE — Telephone Encounter (Signed)
No note from doctor °

## 2012-05-21 ENCOUNTER — Other Ambulatory Visit (HOSPITAL_COMMUNITY): Payer: Self-pay | Admitting: Family Medicine

## 2012-05-21 ENCOUNTER — Other Ambulatory Visit (HOSPITAL_BASED_OUTPATIENT_CLINIC_OR_DEPARTMENT_OTHER): Payer: Self-pay | Admitting: Family Medicine

## 2012-05-21 DIAGNOSIS — Z139 Encounter for screening, unspecified: Secondary | ICD-10-CM

## 2012-06-21 ENCOUNTER — Ambulatory Visit (HOSPITAL_COMMUNITY)
Admission: RE | Admit: 2012-06-21 | Discharge: 2012-06-21 | Disposition: A | Discharge status: Home / Self Care | Payer: Medicare Other | Source: Ambulatory Visit | Attending: Family Medicine | Admitting: Family Medicine

## 2012-06-21 DIAGNOSIS — Z1231 Encounter for screening mammogram for malignant neoplasm of breast: Secondary | ICD-10-CM | POA: Insufficient documentation

## 2012-06-21 DIAGNOSIS — Z139 Encounter for screening, unspecified: Secondary | ICD-10-CM

## 2013-02-16 ENCOUNTER — Ambulatory Visit (HOSPITAL_COMMUNITY)
Admission: RE | Admit: 2013-02-16 | Discharge: 2013-02-16 | Disposition: A | Discharge status: Home / Self Care | Payer: Medicare Other | Source: Ambulatory Visit | Attending: Family Medicine | Admitting: Family Medicine

## 2013-02-16 ENCOUNTER — Other Ambulatory Visit (HOSPITAL_COMMUNITY): Payer: Self-pay | Admitting: Family Medicine

## 2013-02-16 DIAGNOSIS — M545 Low back pain, unspecified: Secondary | ICD-10-CM | POA: Insufficient documentation

## 2013-02-16 DIAGNOSIS — M546 Pain in thoracic spine: Secondary | ICD-10-CM | POA: Insufficient documentation

## 2013-05-25 ENCOUNTER — Ambulatory Visit (HOSPITAL_COMMUNITY)
Admission: RE | Admit: 2013-05-25 | Discharge: 2013-05-25 | Disposition: A | Discharge status: Home / Self Care | Payer: Medicare Other | Source: Ambulatory Visit | Attending: Family Medicine | Admitting: Family Medicine

## 2013-05-25 ENCOUNTER — Other Ambulatory Visit (HOSPITAL_COMMUNITY): Payer: Self-pay | Admitting: Family Medicine

## 2013-05-25 DIAGNOSIS — M25552 Pain in left hip: Secondary | ICD-10-CM

## 2013-05-25 DIAGNOSIS — M25551 Pain in right hip: Secondary | ICD-10-CM

## 2013-05-25 DIAGNOSIS — M25559 Pain in unspecified hip: Secondary | ICD-10-CM | POA: Insufficient documentation

## 2013-06-01 ENCOUNTER — Ambulatory Visit (INDEPENDENT_AMBULATORY_CARE_PROVIDER_SITE_OTHER): Payer: Medicare Other | Admitting: Orthopedic Surgery

## 2013-06-01 VITALS — BP 146/90 | Ht 65.0 in | Wt 155.0 lb

## 2013-06-01 DIAGNOSIS — M5137 Other intervertebral disc degeneration, lumbosacral region: Secondary | ICD-10-CM

## 2013-06-01 DIAGNOSIS — M479 Spondylosis, unspecified: Secondary | ICD-10-CM

## 2013-06-01 NOTE — Patient Instructions (Signed)
Take prednisone for 12 days

## 2013-06-02 ENCOUNTER — Other Ambulatory Visit (HOSPITAL_COMMUNITY): Payer: Self-pay | Admitting: Family Medicine

## 2013-06-02 DIAGNOSIS — Z139 Encounter for screening, unspecified: Secondary | ICD-10-CM

## 2013-06-02 NOTE — Progress Notes (Signed)
Patient ID: Kirsten Hogan, female   DOB: May 06, 1923, 80 y.o.   MRN: [id number] Chief Complaint  Patient presents with  . Hip Pain    Bilateral hip pain left worse than right   HISTORY:   80 year old female with her primary care physician complaining of left hip pain x-rays were obtained showed mild arthritis she also had lumbar spine films which shows previous L34 fusion and degenerative disc disease below and above the level of fusion. She complains of pain over the left block radiating into the left hip associated with a throbbing intermittent discomfort which is worse with walking better with lying still. Pain level IX/X seems to be intermittent worse with walking  He denies numbness tingling locking catching  Review of systems negative except for muscle and joint pain   Past Medical History  Diagnosis Date  . Right thyroid nodule     goiter vs benign cyst on biopsy   . Carotid artery stenosis     right   . Knee pain     right   . Lumbosacral disc disease     spine   . Spondylosis without myelopathy     NOS   . Decreased hearing   . Overweight   . Tear of medial meniscus of knee, current   . Melanoma   . Cataract     NOS   . Glaucoma     NOS   . Arthritis   . Overactive bladder   . Constipation   . Hypertension   . Hyperlipidemia    Past Surgical History  Procedure Laterality Date  . Cataract extraction    . Vesicovaginal fistula closure w/ tah      for excessive bleeding but no cancers   . Exploratory laparotomy    . Excision of facial skin cancer    . Bladder tuck    . Right knee arthorscopy -dr. Aline Brochure -august 2008- menisceal tear    . Posterior interbody arthrodesis, l3-4, posterolateral athrodesis,,l3-4, pedicle screw fixation nonsegmental, s3-4 , with leagacy screws. under  dr. Deforest Hoyles on August 06, 2007    . Appendectomy     Physical Exam(12)  Vital signs: BP 146/90  Ht 5\' 5"  (1.651 m)  Wt 155 lb (70.308 kg)  BMI 25.79 kg/m2  1.GENERAL: normal  development, frame is normal small then.   2. CDV: pulses are normal   3. Skin: normal  4. Lymph: nodes were not palpable/normal  5/6. Psychiatric: awake, alert and oriented, mood and affect normal   7. Neuro: normal sensation  8.   MSK  Gait: Seems to have a labored gait she is using a cane  The pain is located over the buttock area and lower back including a midline lumbar spine tenderness at L5-S1. Range of motion is normal and pain-free motor exam is normal knee is stable radicular signs are negative  X-rays of back and hip show minimal degenerative changes in the hip degenerative disc disease above and below a diffuse L3-4 segment  Assessment degenerative disc disease status post fusion with referred pain to left hip area generated in the lumbar spine  Recommend low-dose prednisone Dosepak followup in 2 weeks reassess

## 2013-06-10 ENCOUNTER — Telehealth: Payer: Self-pay | Admitting: Orthopedic Surgery

## 2013-06-10 NOTE — Telephone Encounter (Signed)
We have spoken with patient/daughter, Kirsten Hogan, ph# [phone], regarding the change of her appointment of 06/15/13, due to Dr Aline Brochure out of office.  We have re-scheduled the appointment to Monday, 06/27/13, which is a re-check for response to medication, Prednisone. Patient is asking if she would need to refill the Prednisone, or finish as directed, and take nothing until her appointment on the 23rd?  Her pharmacy is [facility], [place].  Please call back to daughter at ph# above.

## 2013-06-13 NOTE — Telephone Encounter (Signed)
Advised patient to keep appointment for 06/27/13 and he will reassess at that time.

## 2013-06-15 ENCOUNTER — Ambulatory Visit: Payer: Medicare Other | Admitting: Orthopedic Surgery

## 2013-06-24 ENCOUNTER — Ambulatory Visit (HOSPITAL_COMMUNITY): Payer: Medicare Other

## 2013-06-27 ENCOUNTER — Ambulatory Visit (INDEPENDENT_AMBULATORY_CARE_PROVIDER_SITE_OTHER): Payer: Medicare Other | Admitting: Orthopedic Surgery

## 2013-06-27 ENCOUNTER — Encounter: Payer: Self-pay | Admitting: Orthopedic Surgery

## 2013-06-27 VITALS — BP 141/78 | Ht 65.0 in | Wt 155.0 lb

## 2013-06-27 DIAGNOSIS — M7062 Trochanteric bursitis, left hip: Secondary | ICD-10-CM | POA: Insufficient documentation

## 2013-06-27 DIAGNOSIS — M479 Spondylosis, unspecified: Secondary | ICD-10-CM

## 2013-06-27 DIAGNOSIS — M76899 Other specified enthesopathies of unspecified lower limb, excluding foot: Secondary | ICD-10-CM

## 2013-06-27 NOTE — Patient Instructions (Signed)

## 2013-06-27 NOTE — Progress Notes (Signed)
Patient ID: Kirsten Hogan, female   DOB: Jan 30, 1924, 80 y.o.   MRN: [id number]  Chief Complaint  Patient presents with  . Follow-up    2 week recheck back in response to medication   . Hip Pain    Left hip pain over the greater trochanter lumbar spine pain has resolved    BP 141/78  Ht 5\' 5"  (1.651 m)  Wt 155 lb (70.308 kg)  BMI 25.79 kg/m2  Encounter Diagnoses  Name Primary?  . Spondylosis of unspecified site without mention of myelopathy Yes  . Trochanteric bursitis of left hip     New problem pain over the left greater trochanter. The patient's previous symptoms of spondylosis have resolved with oral steroids. Patient had excellent relief and was able to sleep at night without any issues but complains now of the right pain over the greater trochanteric bursa of the left hip. Denies groin pain.  She has direct tenderness over the bursa mild swelling.  Recommend injection  Left hip injection trochanteric bursa Verbal consent Confirmatory timeout Depo-Medrol 40 mg, 1% lidocaine 3 cc 22-gauge needle was used to inject the left greater trochanteric bursa after prep with alcohol and anesthesia with ethyl chloride  No complications  Repeat injection if needed in 2 weeks

## 2013-06-30 ENCOUNTER — Ambulatory Visit (HOSPITAL_COMMUNITY): Payer: Medicare Other

## 2013-07-04 ENCOUNTER — Ambulatory Visit (HOSPITAL_COMMUNITY)
Admission: RE | Admit: 2013-07-04 | Discharge: 2013-07-04 | Disposition: A | Discharge status: Home / Self Care | Payer: Medicare Other | Source: Ambulatory Visit | Attending: Family Medicine | Admitting: Family Medicine

## 2013-07-04 DIAGNOSIS — Z139 Encounter for screening, unspecified: Secondary | ICD-10-CM

## 2013-07-04 DIAGNOSIS — Z1231 Encounter for screening mammogram for malignant neoplasm of breast: Secondary | ICD-10-CM | POA: Insufficient documentation

## 2013-07-12 ENCOUNTER — Ambulatory Visit (INDEPENDENT_AMBULATORY_CARE_PROVIDER_SITE_OTHER): Payer: Medicare Other | Admitting: Orthopedic Surgery

## 2013-07-12 VITALS — BP 113/92 | Ht 65.0 in | Wt 155.0 lb

## 2013-07-12 DIAGNOSIS — M76899 Other specified enthesopathies of unspecified lower limb, excluding foot: Secondary | ICD-10-CM

## 2013-07-12 DIAGNOSIS — M7062 Trochanteric bursitis, left hip: Secondary | ICD-10-CM

## 2013-07-12 NOTE — Progress Notes (Signed)
Patient ID: Kirsten Hogan, female   DOB: May 23, 1923, 80 y.o.   MRN: [id number]  Chief Complaint  Patient presents with  . Follow-up    Left hip pain, request injection    BP 113/92  Ht 5\' 5"  (1.651 m)  Wt 155 lb (70.308 kg)  BMI 25.79 kg/m2   Left hip injection trochanteric bursa Verbal consent Confirmatory timeout Depo-Medrol 40 mg, 1% lidocaine 3 cc 22-gauge needle was used to inject the left greater trochanteric bursa after prep with alcohol and anesthesia with ethyl chloride  No complications  Repeat injection today continued pain of her left greater trochanter associated with a limp  I also put her on a Dosepak in case any of his pain is coming from her lumbar spine condition  Followup 2 weeks

## 2013-07-12 NOTE — Patient Instructions (Signed)
You have received a steroid shot. 15% of patients experience increased pain at the injection site with in the next 24 hours. This is best treated with ice and tylenol extra strength 2 tabs every 8 hours. If you are still having pain please call the office.    

## 2013-07-28 ENCOUNTER — Ambulatory Visit (INDEPENDENT_AMBULATORY_CARE_PROVIDER_SITE_OTHER): Payer: Medicare Other | Admitting: Orthopedic Surgery

## 2013-07-28 VITALS — BP 140/79 | Ht 65.0 in | Wt 155.0 lb

## 2013-07-28 DIAGNOSIS — M479 Spondylosis, unspecified: Secondary | ICD-10-CM

## 2013-07-28 DIAGNOSIS — M76899 Other specified enthesopathies of unspecified lower limb, excluding foot: Secondary | ICD-10-CM

## 2013-07-28 DIAGNOSIS — M7062 Trochanteric bursitis, left hip: Secondary | ICD-10-CM

## 2013-07-28 MED ORDER — TRAMADOL-ACETAMINOPHEN 37.5-325 MG PO TABS: 1.0000 | ORAL_TABLET | ORAL | Status: DC | PRN

## 2013-07-28 NOTE — Progress Notes (Signed)
Subjective:     Patient ID: Kirsten Hogan, female   DOB: March 16, 1924, 80 y.o.   MRN: [id number]  Chief Complaint  Patient presents with  . Follow-up    2 week recheck on left hip after injection.    HPI Status post 2 injections left hip for bursitis also has some tenderness in the lower back and buttock area. She says she's sleeping better at night and the pain is not as bad after 2 rounds of steroids and the injections I think each interventionist treating a different problem 1 the injection as treatment of bursitis and the steroids orally are treating the spondylosis  Review of Systems No neurologic complaints at this time    Objective:   Physical Exam She ambulates with a cane she is awake alert and oriented x3 she is well groomed her mood is pleasant she has tenderness in the left SI joint and buttock and over the left greater trochanter hip flexion internal/external rotation is normal and pain-free    Assessment:     Controlled Encounter Diagnoses  Name Primary?  . Trochanteric bursitis of left hip Yes  . Spondylosis of unspecified site without mention of myelopathy        Plan:     Followup as needed use Ultracet at night for severe pain

## 2013-07-28 NOTE — Patient Instructions (Signed)
Take ultracet at night if needed   Meds ordered this encounter  Medications  . traMADol-acetaminophen (ULTRACET) 37.5-325 MG per tablet    Sig: Take 1 tablet by mouth every 4 (four) hours as needed.    Dispense:  30 tablet    Refill:  5

## 2013-08-24 ENCOUNTER — Telehealth: Payer: Self-pay | Admitting: *Deleted

## 2013-08-24 ENCOUNTER — Other Ambulatory Visit: Payer: Self-pay | Admitting: Orthopedic Surgery

## 2013-08-24 MED ORDER — PREDNISONE (PAK) 5 MG PO TABS: 5.0000 mg | ORAL_TABLET | ORAL | Status: DC

## 2013-08-24 MED ORDER — PREDNISONE (PAK) 5 MG PO TABS: 5.0000 mg | ORAL_TABLET | ORAL | Status: AC

## 2013-08-24 NOTE — Telephone Encounter (Signed)
Patient called with complaints of left hip pain. She wants to know if she can get a refill on the prednisone dose pack or another injection? I advised it was too soon for another injection. Last injection 07/12/13. Please advise.

## 2013-08-24 NOTE — Telephone Encounter (Signed)
Called patient and informed her that Dr. Aline Brochure sent prescription in to the pharmacy.

## 2013-08-24 NOTE — Telephone Encounter (Signed)
i sent prednisone pack in to pharmacy by fax

## 2013-09-27 ENCOUNTER — Ambulatory Visit (INDEPENDENT_AMBULATORY_CARE_PROVIDER_SITE_OTHER): Payer: Medicare Other | Admitting: Orthopedic Surgery

## 2013-09-27 VITALS — BP 139/77 | Ht 65.0 in | Wt 155.0 lb

## 2013-09-27 DIAGNOSIS — M5137 Other intervertebral disc degeneration, lumbosacral region: Secondary | ICD-10-CM

## 2013-09-27 DIAGNOSIS — M479 Spondylosis, unspecified: Secondary | ICD-10-CM

## 2013-09-27 DIAGNOSIS — M76899 Other specified enthesopathies of unspecified lower limb, excluding foot: Secondary | ICD-10-CM

## 2013-09-27 DIAGNOSIS — M7062 Trochanteric bursitis, left hip: Secondary | ICD-10-CM

## 2013-09-27 NOTE — Progress Notes (Signed)
Patient ID: Kirsten Hogan, female   DOB: 1924/04/16, 80 y.o.   MRN: [id number]  Chief Complaint  Patient presents with  . Follow-up    2 month recheck on hip and back, response to medication and injection.     The patient has done well no pain at this time in the hip or the back.  Review of systems no red flags  Exam shows a healthy well-developed well-nourished female grooming and hygiene are intact she is oriented x3 mood is normal gait is supported by a cane  She has excellent hip flexion normal range of motion in the hip without pain or tenderness there is no tenderness in the lower her buttock  Impression resolved lumbar spine pain with radiation to the hip  Followup as needed

## 2013-09-27 NOTE — Patient Instructions (Signed)
activities as tolerated 

## 2013-11-07 ENCOUNTER — Ambulatory Visit (HOSPITAL_COMMUNITY): Admission: RE | Admit: 2013-11-07 | Payer: Medicare Other | Source: Ambulatory Visit

## 2014-01-05 ENCOUNTER — Other Ambulatory Visit: Payer: Self-pay | Admitting: Orthopedic Surgery

## 2014-01-05 ENCOUNTER — Telehealth: Payer: Self-pay | Admitting: Orthopedic Surgery

## 2014-01-05 MED ORDER — TRAMADOL-ACETAMINOPHEN 37.5-325 MG PO TABS: 1.0000 | ORAL_TABLET | ORAL | Status: AC | PRN

## 2014-01-05 NOTE — Telephone Encounter (Signed)
Routing to dr harrison 

## 2014-01-05 NOTE — Telephone Encounter (Signed)
Patient called to request prescription for pain medication for problem of right shoulder pain "with raising it up" - last seen in office 09/27/13 for hip, and for this problem 12/24/11.  Please advise.  Patient's ph# is [phone]

## 2014-03-16 IMAGING — MG MM DIGITAL SCREENING
4 series · 4 of 4 positions shown · non-contrast
Comparison: Previous exam(s).

CLINICAL DATA: Screening.

EXAM:
DIGITAL SCREENING BILATERAL MAMMOGRAM WITH CAD

[L CC]
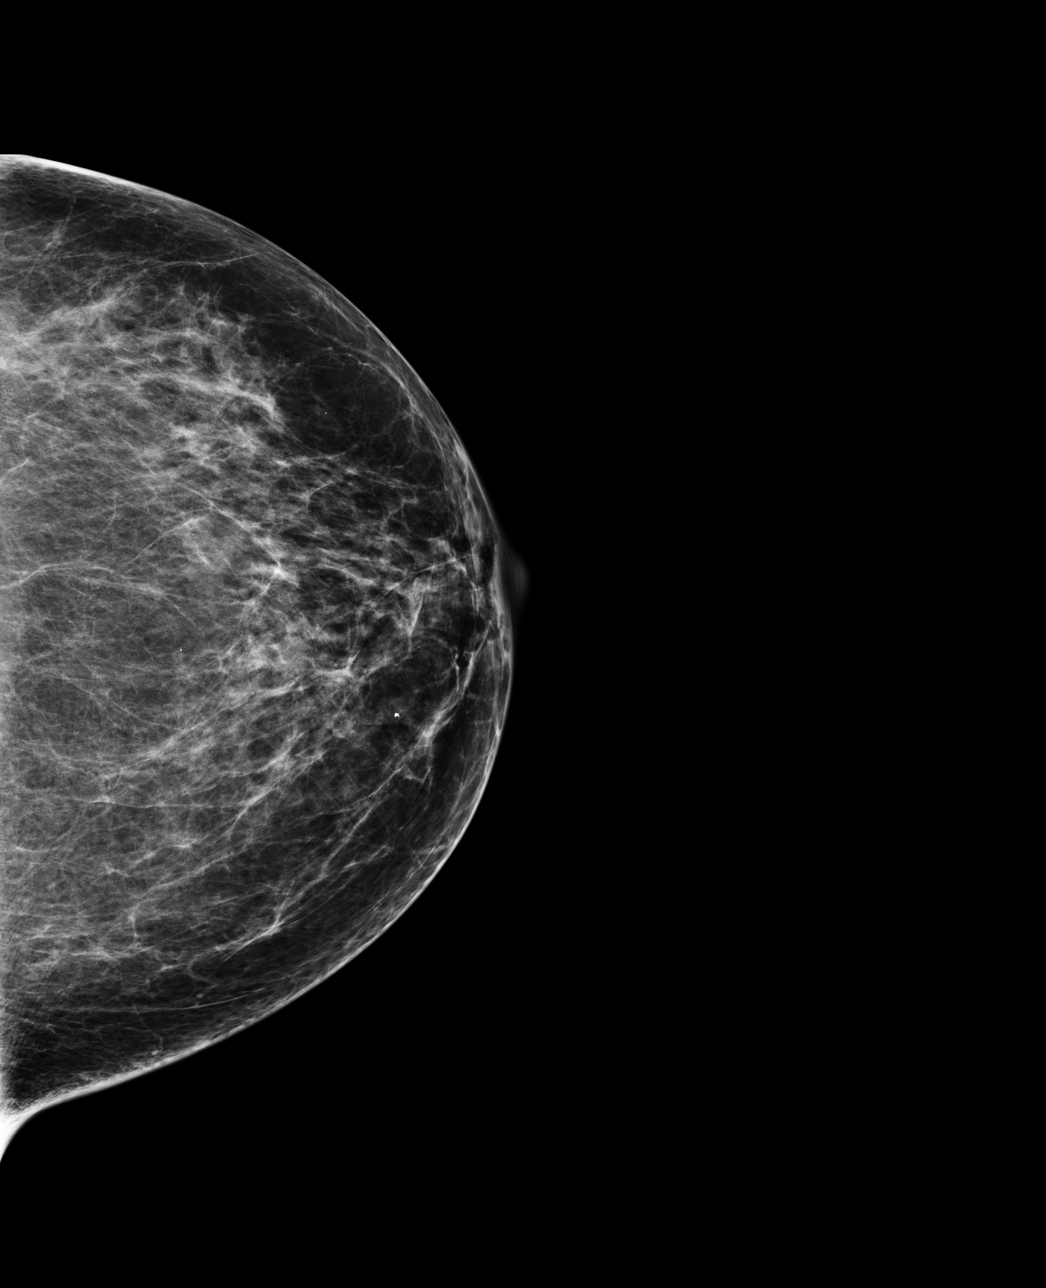

[L MLO]
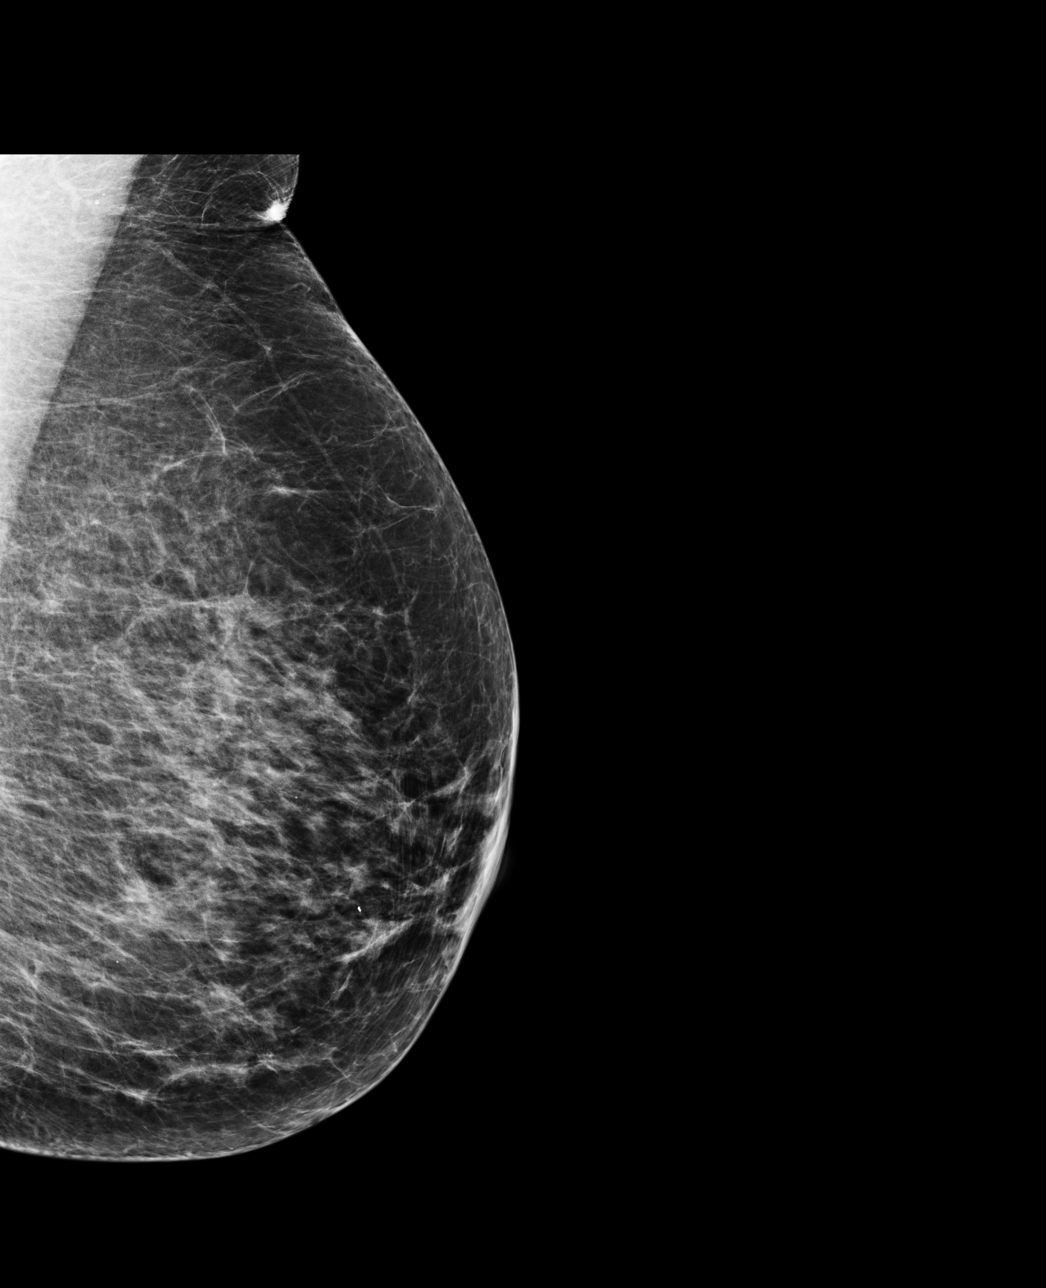

[R CC]
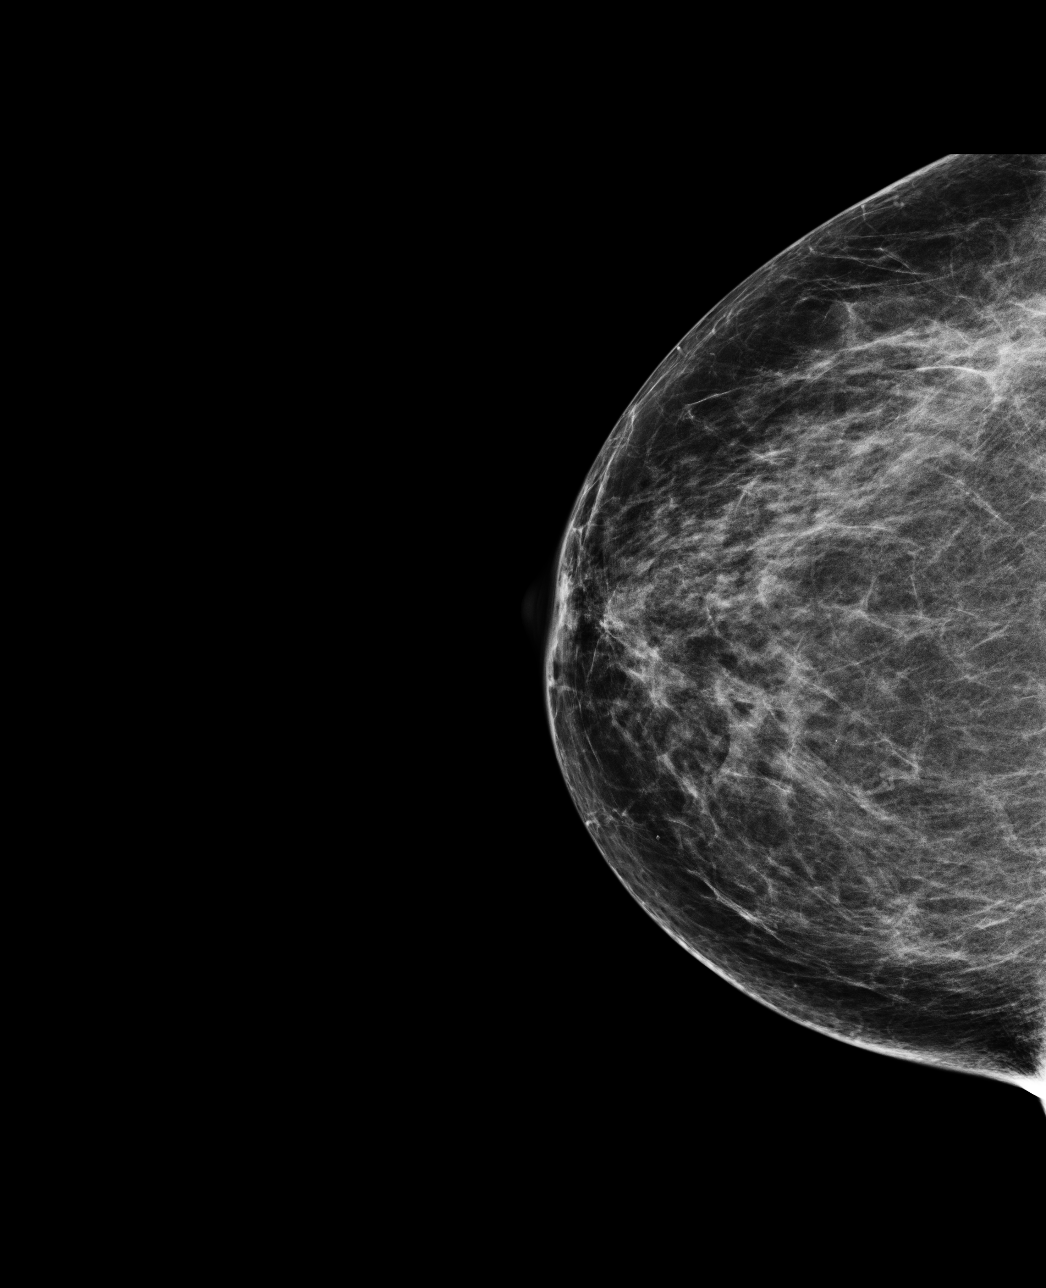

[R MLO]
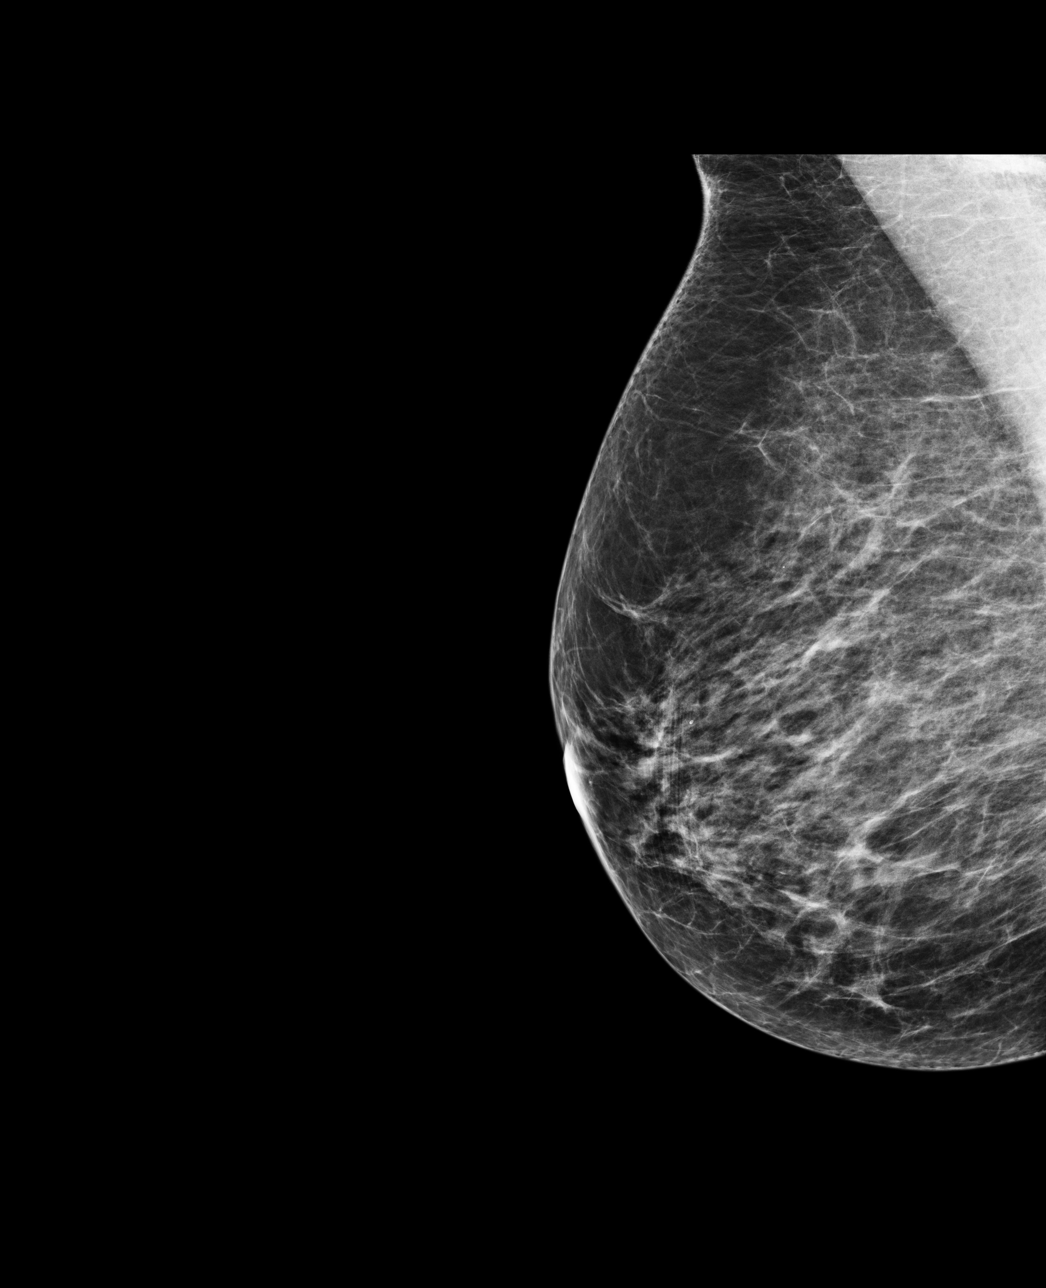

[4 of 4 positions shown; findings below may reference images not displayed]

ACR Breast Density Category b: There are scattered areas of
fibroglandular density.
FINDINGS: There are no findings suspicious for malignancy. Images were
processed with CAD.
IMPRESSION: No mammographic evidence of malignancy. A result letter of this
screening mammogram will be mailed directly to the patient.

RECOMMENDATION:
Screening mammogram in one year. (Code:AS-G-LCT)

BI-RADS CATEGORY  1: Negative.

## 2014-04-10 ENCOUNTER — Encounter: Payer: Self-pay | Admitting: Orthopedic Surgery

## 2014-04-10 ENCOUNTER — Ambulatory Visit (INDEPENDENT_AMBULATORY_CARE_PROVIDER_SITE_OTHER): Payer: Medicare Other | Admitting: Orthopedic Surgery

## 2014-04-10 DIAGNOSIS — M5137 Other intervertebral disc degeneration, lumbosacral region: Secondary | ICD-10-CM

## 2014-04-10 DIAGNOSIS — M75101 Unspecified rotator cuff tear or rupture of right shoulder, not specified as traumatic: Secondary | ICD-10-CM

## 2014-04-10 NOTE — Patient Instructions (Signed)

## 2014-04-10 NOTE — Progress Notes (Signed)
Patient ID: Kirsten Hogan, female   DOB: 06/17/1923, 80 y.o.   MRN: [id number] Chief Complaint  Patient presents with  . Shoulder Pain    right shoulder pain recurrent     The patient has history of right rotator cuff tear treated with injections mainly because they have helped her pain and she has full for elevation of her shoulder with only mild weakness. She comes in today complaining of right shoulder pain for the last 2-3 weeks without relief from over-the-counter medications or all set. Reports no weakness and no trauma. Pain is intermittent in activity related  Review of systems she denies numbness or tingling weakness or giving way symptoms of the right shoulder no fever chills.  Medical history Past Medical History  Diagnosis Date  . Right thyroid nodule     goiter vs benign cyst on biopsy   . Carotid artery stenosis     right   . Knee pain     right   . Lumbosacral disc disease     spine   . Spondylosis without myelopathy     NOS   . Decreased hearing   . Overweight(278.02)   . Tear of medial meniscus of knee, current   . Melanoma   . Cataract     NOS   . Glaucoma     NOS   . Arthritis   . Overactive bladder   . Constipation   . Hypertension   . Hyperlipidemia     She is awake alert and oriented 3 mood and affect is normal. Her appearance is excellent in terms of grooming and hygiene her mood is pleasant her affect is cheerful.  Gait is noncontributory  She has palpable tenderness around the acromion. She has forward elevation of her 160 she has normal external rotation mild decrease in internal rotation. Apprehension test normal internal/external rotation strength normal rotator cuff strength 5 out of 5. Skin normal. Pulse normal. Sensation normal. Reflexes normal. Lymph nodes negative.  No x-rays today  Review of her previous MRI report from 2013 FINDINGS: Rotator cuff:  18 x 14 mm full-thickness tear of the anterior aspect of the distal supraspinatus  tendon.  The tear extends longitudinally into the musculotendinous junction.  Slight retraction of the tendon.  No atrophy of the muscle.   Small intrasubstance tear at the musculotendinous junction of the infraspinatus.  The rotator cuff is otherwise normal. Muscles:  No atrophy or edema in the muscles of the rotator cuff. Biceps long head:  Properly located and intact.   Encounter Diagnoses  Name Primary?  . Degeneration of lumbar or lumbosacral intervertebral disc   . Rotator cuff syndrome of right shoulder Yes   Her degeneration of the lumbar spine is stable without complaints today.  She is agreeable to injection on the right shoulder subacromial space  .Procedure note the subacromial injection shoulder right   Verbal consent was obtained to inject the  Right   Shoulder  Timeout was completed to confirm the injection site is a subacromial space of the  Right  shoulder   Medication used Depo-Medrol 40 mg and lidocaine 1% 3 cc  Anesthesia was provided by ethyl chloride  The injection was performed in the right  posterior subacromial space. After pinning the skin with alcohol and anesthetized the skin with ethyl chloride the subacromial space was injected using a 20-gauge needle. There were no complications  Sterile dressing was applied.

## 2014-05-25 ENCOUNTER — Ambulatory Visit (INDEPENDENT_AMBULATORY_CARE_PROVIDER_SITE_OTHER): Payer: Self-pay | Admitting: Otolaryngology

## 2014-05-25 DIAGNOSIS — H903 Sensorineural hearing loss, bilateral: Secondary | ICD-10-CM

## 2014-05-25 DIAGNOSIS — H6123 Impacted cerumen, bilateral: Secondary | ICD-10-CM

## 2014-08-03 ENCOUNTER — Other Ambulatory Visit (HOSPITAL_COMMUNITY): Payer: Self-pay | Admitting: Family Medicine

## 2014-08-03 DIAGNOSIS — Z1231 Encounter for screening mammogram for malignant neoplasm of breast: Secondary | ICD-10-CM

## 2014-09-08 ENCOUNTER — Ambulatory Visit (HOSPITAL_COMMUNITY)
Admission: RE | Admit: 2014-09-08 | Discharge: 2014-09-08 | Disposition: A | Discharge status: Home / Self Care | Payer: Medicare Other | Source: Ambulatory Visit | Attending: Family Medicine | Admitting: Family Medicine

## 2014-09-08 DIAGNOSIS — Z1231 Encounter for screening mammogram for malignant neoplasm of breast: Secondary | ICD-10-CM | POA: Insufficient documentation

## 2015-08-24 ENCOUNTER — Other Ambulatory Visit (HOSPITAL_COMMUNITY): Payer: Self-pay | Admitting: Family Medicine

## 2015-08-24 DIAGNOSIS — Z1231 Encounter for screening mammogram for malignant neoplasm of breast: Secondary | ICD-10-CM

## 2015-09-10 ENCOUNTER — Ambulatory Visit (HOSPITAL_COMMUNITY)
Admission: RE | Admit: 2015-09-10 | Discharge: 2015-09-10 | Disposition: A | Discharge status: Home / Self Care | Payer: Medicare Other | Source: Ambulatory Visit | Attending: Family Medicine | Admitting: Family Medicine

## 2015-09-10 DIAGNOSIS — Z1231 Encounter for screening mammogram for malignant neoplasm of breast: Secondary | ICD-10-CM | POA: Insufficient documentation

## 2015-09-14 LAB — BASIC METABOLIC PANEL WITH GFR: Glucose: 84 mg/dL

## 2016-10-14 ENCOUNTER — Other Ambulatory Visit (HOSPITAL_COMMUNITY): Payer: Self-pay | Admitting: Family Medicine

## 2016-10-14 DIAGNOSIS — Z1231 Encounter for screening mammogram for malignant neoplasm of breast: Secondary | ICD-10-CM

## 2016-10-17 ENCOUNTER — Ambulatory Visit (HOSPITAL_COMMUNITY)
Admission: RE | Admit: 2016-10-17 | Discharge: 2016-10-17 | Disposition: A | Discharge status: Home / Self Care | Payer: Medicare Other | Source: Ambulatory Visit | Attending: Family Medicine | Admitting: Family Medicine

## 2016-10-17 DIAGNOSIS — Z1231 Encounter for screening mammogram for malignant neoplasm of breast: Secondary | ICD-10-CM | POA: Insufficient documentation

## 2017-04-09 ENCOUNTER — Encounter (HOSPITAL_COMMUNITY): Payer: Self-pay | Admitting: Emergency Medicine

## 2017-04-09 ENCOUNTER — Emergency Department (HOSPITAL_COMMUNITY)
Admission: EM | Admit: 2017-04-09 | Discharge: 2017-04-09 | Disposition: A | Discharge status: Home / Self Care | Payer: Medicare Other | Attending: Emergency Medicine | Admitting: Emergency Medicine

## 2017-04-09 ENCOUNTER — Emergency Department (HOSPITAL_COMMUNITY): Payer: Medicare Other

## 2017-04-09 DIAGNOSIS — Z79899 Other long term (current) drug therapy: Secondary | ICD-10-CM | POA: Insufficient documentation

## 2017-04-09 DIAGNOSIS — Y999 Unspecified external cause status: Secondary | ICD-10-CM | POA: Diagnosis not present

## 2017-04-09 DIAGNOSIS — Y929 Unspecified place or not applicable: Secondary | ICD-10-CM | POA: Diagnosis not present

## 2017-04-09 DIAGNOSIS — M25562 Pain in left knee: Secondary | ICD-10-CM | POA: Insufficient documentation

## 2017-04-09 DIAGNOSIS — Y939 Activity, unspecified: Secondary | ICD-10-CM | POA: Insufficient documentation

## 2017-04-09 DIAGNOSIS — S8992XA Unspecified injury of left lower leg, initial encounter: Secondary | ICD-10-CM | POA: Diagnosis present

## 2017-04-09 DIAGNOSIS — I10 Essential (primary) hypertension: Secondary | ICD-10-CM | POA: Diagnosis not present

## 2017-04-09 DIAGNOSIS — R079 Chest pain, unspecified: Secondary | ICD-10-CM

## 2017-04-09 DIAGNOSIS — S8002XA Contusion of left knee, initial encounter: Secondary | ICD-10-CM | POA: Insufficient documentation

## 2017-04-09 DIAGNOSIS — Z7982 Long term (current) use of aspirin: Secondary | ICD-10-CM | POA: Diagnosis not present

## 2017-04-09 MED ORDER — ACETAMINOPHEN 325 MG PO TABS
ORAL_TABLET | ORAL | Status: AC
  Filled 2017-04-09: qty 2

## 2017-04-09 MED ORDER — ACETAMINOPHEN 325 MG PO TABS
650.0000 mg | ORAL_TABLET | Freq: Once | ORAL | Status: AC
  Administered 2017-04-09: 650 mg via ORAL

## 2017-04-09 NOTE — Discharge Instructions (Signed)
Tylenol or Motrin for pain follow-up with your family doctor next week if any problems

## 2017-04-09 NOTE — ED Notes (Signed)
Pt back from x-ray, family at bedside
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
## 2017-04-09 NOTE — ED Notes (Signed)
Patient transported to X-ray 
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
## 2017-08-01 LAB — GLUCOSE, POCT (MANUAL RESULT ENTRY): POC Glucose: 89 mg/dl (ref 70–99)

## 2017-08-01 LAB — POCT GLYCOSYLATED HEMOGLOBIN (HGB A1C)

## 2017-11-26 LAB — GLUCOSE, POCT (MANUAL RESULT ENTRY): POC Glucose: 81 mg/dl (ref 70–99)

## 2018-02-24 ENCOUNTER — Other Ambulatory Visit (HOSPITAL_COMMUNITY): Payer: Self-pay | Admitting: Family Medicine

## 2018-02-24 DIAGNOSIS — S069X9A Unspecified intracranial injury with loss of consciousness of unspecified duration, initial encounter: Secondary | ICD-10-CM

## 2018-03-01 ENCOUNTER — Ambulatory Visit (HOSPITAL_COMMUNITY)
Admission: RE | Admit: 2018-03-01 | Discharge: 2018-03-01 | Disposition: A | Discharge status: Home / Self Care | Payer: Medicare Other | Source: Ambulatory Visit | Attending: Family Medicine | Admitting: Family Medicine

## 2018-03-01 DIAGNOSIS — S069X9A Unspecified intracranial injury with loss of consciousness of unspecified duration, initial encounter: Secondary | ICD-10-CM | POA: Insufficient documentation

## 2018-03-11 ENCOUNTER — Other Ambulatory Visit (HOSPITAL_COMMUNITY): Payer: Self-pay | Admitting: Family Medicine

## 2018-03-11 DIAGNOSIS — Z1231 Encounter for screening mammogram for malignant neoplasm of breast: Secondary | ICD-10-CM

## 2018-03-18 ENCOUNTER — Ambulatory Visit (HOSPITAL_COMMUNITY): Payer: Medicare Other

## 2018-03-31 ENCOUNTER — Ambulatory Visit (HOSPITAL_COMMUNITY)
Admission: RE | Admit: 2018-03-31 | Discharge: 2018-03-31 | Disposition: A | Discharge status: Home / Self Care | Payer: Medicare Other | Source: Ambulatory Visit | Attending: Family Medicine | Admitting: Family Medicine

## 2018-03-31 ENCOUNTER — Ambulatory Visit (HOSPITAL_COMMUNITY): Payer: Medicare Other

## 2018-03-31 DIAGNOSIS — Z1231 Encounter for screening mammogram for malignant neoplasm of breast: Secondary | ICD-10-CM | POA: Insufficient documentation

## 2019-04-28 ENCOUNTER — Other Ambulatory Visit (HOSPITAL_COMMUNITY): Payer: Self-pay | Admitting: Family Medicine

## 2019-04-28 DIAGNOSIS — Z1231 Encounter for screening mammogram for malignant neoplasm of breast: Secondary | ICD-10-CM

## 2019-05-05 ENCOUNTER — Ambulatory Visit (HOSPITAL_COMMUNITY)
Admission: RE | Admit: 2019-05-05 | Discharge: 2019-05-05 | Disposition: A | Discharge status: Home / Self Care | Payer: Medicare Other | Source: Ambulatory Visit | Attending: Family Medicine | Admitting: Family Medicine

## 2019-05-05 ENCOUNTER — Other Ambulatory Visit: Payer: Self-pay

## 2019-05-05 DIAGNOSIS — Z1231 Encounter for screening mammogram for malignant neoplasm of breast: Secondary | ICD-10-CM | POA: Diagnosis not present

## 2019-06-11 ENCOUNTER — Other Ambulatory Visit: Payer: Self-pay

## 2019-06-11 ENCOUNTER — Ambulatory Visit: Payer: Medicare PPO | Attending: Internal Medicine

## 2019-06-11 DIAGNOSIS — Z23 Encounter for immunization: Secondary | ICD-10-CM | POA: Insufficient documentation

## 2019-06-11 NOTE — Progress Notes (Signed)
   Covid-19 Vaccination Clinic  Name:  Kirsten Hogan    MRN: [id number] DOB: 22-Dec-1923  06/11/2019  [name] was observed post Covid-19 immunization for 15 minutes without incidence. She was provided with Vaccine Information Sheet and instruction to access the V-Safe system.   [name] was instructed to call 911 with any severe reactions post vaccine: Marland Kitchen Difficulty breathing  . Swelling of your face and throat  . A fast heartbeat  . A bad rash all over your body  . Dizziness and weakness    Immunizations Administered    Name Date Dose VIS Date Route   Moderna COVID-19 Vaccine 06/11/2019 11:40 AM 0.5 mL 04/05/2019 Intramuscular   Manufacturer: Moderna   Lot: [id number]   [place]PO:[id number]

## 2019-07-12 ENCOUNTER — Ambulatory Visit: Payer: Medicare PPO | Attending: Internal Medicine

## 2019-07-12 DIAGNOSIS — Z23 Encounter for immunization: Secondary | ICD-10-CM

## 2019-07-12 NOTE — Progress Notes (Signed)
   Covid-19 Vaccination Clinic  Name:  JAKIYA SWOVELAND    MRN: [id number] DOB: March 25, 1924  07/12/2019  Ms. Muhlbach was observed post Covid-19 immunization for 15 minutes without incident. She was provided with Vaccine Information Sheet and instruction to access the V-Safe system.   Ms. Schutt was instructed to call 911 with any severe reactions post vaccine: Marland Kitchen Difficulty breathing  . Swelling of face and throat  . A fast heartbeat  . A bad rash all over body  . Dizziness and weakness   Immunizations Administered    Name Date Dose VIS Date Route   Moderna COVID-19 Vaccine 07/12/2019 11:41 AM 0.5 mL 04/05/2019 Intramuscular   Manufacturer: Moderna   Lot: [id number]   [place]:[id number]

## 2019-11-07 DIAGNOSIS — E785 Hyperlipidemia, unspecified: Secondary | ICD-10-CM | POA: Diagnosis not present

## 2019-11-07 DIAGNOSIS — I10 Essential (primary) hypertension: Secondary | ICD-10-CM | POA: Diagnosis not present

## 2020-02-08 DIAGNOSIS — H353132 Nonexudative age-related macular degeneration, bilateral, intermediate dry stage: Secondary | ICD-10-CM | POA: Diagnosis not present

## 2020-03-06 DIAGNOSIS — I10 Essential (primary) hypertension: Secondary | ICD-10-CM | POA: Diagnosis not present

## 2020-03-06 DIAGNOSIS — M159 Polyosteoarthritis, unspecified: Secondary | ICD-10-CM | POA: Diagnosis not present

## 2020-03-06 DIAGNOSIS — E785 Hyperlipidemia, unspecified: Secondary | ICD-10-CM | POA: Diagnosis not present

## 2020-03-06 DIAGNOSIS — Z23 Encounter for immunization: Secondary | ICD-10-CM | POA: Diagnosis not present

## 2020-03-07 DIAGNOSIS — Z23 Encounter for immunization: Secondary | ICD-10-CM | POA: Diagnosis not present

## 2020-05-01 DIAGNOSIS — I10 Essential (primary) hypertension: Secondary | ICD-10-CM | POA: Diagnosis not present

## 2020-05-14 DIAGNOSIS — H401131 Primary open-angle glaucoma, bilateral, mild stage: Secondary | ICD-10-CM | POA: Diagnosis not present

## 2020-06-12 DIAGNOSIS — M159 Polyosteoarthritis, unspecified: Secondary | ICD-10-CM | POA: Diagnosis not present

## 2020-06-12 DIAGNOSIS — E785 Hyperlipidemia, unspecified: Secondary | ICD-10-CM | POA: Diagnosis not present

## 2020-06-12 DIAGNOSIS — I10 Essential (primary) hypertension: Secondary | ICD-10-CM | POA: Diagnosis not present

## 2020-06-12 DIAGNOSIS — N390 Urinary tract infection, site not specified: Secondary | ICD-10-CM | POA: Diagnosis not present

## 2020-08-13 DIAGNOSIS — H401122 Primary open-angle glaucoma, left eye, moderate stage: Secondary | ICD-10-CM | POA: Diagnosis not present

## 2020-09-06 DIAGNOSIS — Z23 Encounter for immunization: Secondary | ICD-10-CM | POA: Diagnosis not present

## 2020-09-11 DIAGNOSIS — M159 Polyosteoarthritis, unspecified: Secondary | ICD-10-CM | POA: Diagnosis not present

## 2020-09-11 DIAGNOSIS — M150 Primary generalized (osteo)arthritis: Secondary | ICD-10-CM | POA: Diagnosis not present

## 2020-09-11 DIAGNOSIS — Z6823 Body mass index (BMI) 23.0-23.9, adult: Secondary | ICD-10-CM | POA: Diagnosis not present

## 2020-09-11 DIAGNOSIS — I10 Essential (primary) hypertension: Secondary | ICD-10-CM | POA: Diagnosis not present

## 2020-09-11 DIAGNOSIS — E785 Hyperlipidemia, unspecified: Secondary | ICD-10-CM | POA: Diagnosis not present

## 2020-12-11 DIAGNOSIS — E785 Hyperlipidemia, unspecified: Secondary | ICD-10-CM | POA: Diagnosis not present

## 2020-12-11 DIAGNOSIS — I10 Essential (primary) hypertension: Secondary | ICD-10-CM | POA: Diagnosis not present

## 2020-12-11 DIAGNOSIS — M159 Polyosteoarthritis, unspecified: Secondary | ICD-10-CM | POA: Diagnosis not present

## 2021-01-17 DIAGNOSIS — B078 Other viral warts: Secondary | ICD-10-CM | POA: Diagnosis not present

## 2021-01-17 DIAGNOSIS — L821 Other seborrheic keratosis: Secondary | ICD-10-CM | POA: Diagnosis not present

## 2021-01-17 DIAGNOSIS — L218 Other seborrheic dermatitis: Secondary | ICD-10-CM | POA: Diagnosis not present

## 2021-01-19 DIAGNOSIS — R55 Syncope and collapse: Secondary | ICD-10-CM | POA: Diagnosis not present

## 2021-01-21 DIAGNOSIS — R55 Syncope and collapse: Secondary | ICD-10-CM | POA: Diagnosis not present

## 2021-01-23 ENCOUNTER — Other Ambulatory Visit: Payer: Self-pay | Admitting: Family Medicine

## 2021-01-23 ENCOUNTER — Telehealth: Payer: Self-pay | Admitting: *Deleted

## 2021-01-23 ENCOUNTER — Ambulatory Visit (INDEPENDENT_AMBULATORY_CARE_PROVIDER_SITE_OTHER): Payer: Medicare PPO

## 2021-01-23 DIAGNOSIS — R55 Syncope and collapse: Secondary | ICD-10-CM

## 2021-01-23 NOTE — Telephone Encounter (Signed)
Faxed order from Dr. Iona Beard requesting a holter monitor. Order placed. Called to notify pt. No answer. Left msg to call back.

## 2021-01-24 NOTE — Telephone Encounter (Signed)
Spoke with daughter ( DPR) notified pt to wear for 3 days and return by mail.

## 2021-01-27 DIAGNOSIS — R55 Syncope and collapse: Secondary | ICD-10-CM

## 2021-02-05 DIAGNOSIS — R55 Syncope and collapse: Secondary | ICD-10-CM | POA: Diagnosis not present

## 2021-02-19 ENCOUNTER — Other Ambulatory Visit (HOSPITAL_COMMUNITY): Payer: Self-pay

## 2021-02-19 MED ORDER — INFLUENZA VAC A&B SA ADJ QUAD 0.5 ML IM PRSY
PREFILLED_SYRINGE | INTRAMUSCULAR | 0 refills | Status: AC
  Filled 2021-02-19 (×2): qty 0.5, 1d supply, fill #0

## 2021-02-20 ENCOUNTER — Other Ambulatory Visit (HOSPITAL_COMMUNITY): Payer: Self-pay

## 2021-03-01 ENCOUNTER — Ambulatory Visit: Payer: Medicare PPO | Admitting: Cardiology

## 2021-03-01 ENCOUNTER — Other Ambulatory Visit: Payer: Self-pay

## 2021-03-01 ENCOUNTER — Other Ambulatory Visit (HOSPITAL_COMMUNITY)
Admission: RE | Admit: 2021-03-01 | Discharge: 2021-03-01 | Disposition: A | Discharge status: Home / Self Care | Payer: Medicare PPO | Source: Ambulatory Visit | Attending: Family Medicine | Admitting: Family Medicine

## 2021-03-01 VITALS — BP 128/70 | HR 71 | Ht 66.5 in | Wt 133.0 lb

## 2021-03-01 DIAGNOSIS — R55 Syncope and collapse: Secondary | ICD-10-CM | POA: Diagnosis not present

## 2021-03-01 DIAGNOSIS — I6521 Occlusion and stenosis of right carotid artery: Secondary | ICD-10-CM | POA: Insufficient documentation

## 2021-03-01 DIAGNOSIS — I10 Essential (primary) hypertension: Secondary | ICD-10-CM | POA: Diagnosis not present

## 2021-03-01 LAB — BASIC METABOLIC PANEL
Anion gap: 7 (ref 5–15)
BUN: 14 mg/dL (ref 8–23)
CO2: 30 mmol/L (ref 22–32)
Calcium: 9.4 mg/dL (ref 8.9–10.3)
Chloride: 101 mmol/L (ref 98–111)
Creatinine, Ser: 0.76 mg/dL (ref 0.44–1.00)
GFR, Estimated: >60 mL/min (ref >60)
Glucose, Bld: 89 mg/dL (ref 70–99)
Potassium: 3.5 mmol/L (ref 3.5–5.1)
Sodium: 138 mmol/L (ref 135–145)

## 2021-03-01 LAB — MAGNESIUM: Magnesium: 1.9 mg/dL (ref 1.7–2.4)

## 2021-03-01 NOTE — Progress Notes (Signed)
Cardiology CONSULT Note    Date:  03/01/2021   ID:  Kirsten Hogan, DOB 17-Oct-1923, MRN [id number]  PCP:  Iona Beard, MD  Cardiologist:  Fransico Him, MD   Chief Complaint  Patient presents with   New Patient (Initial Visit)    Syncope, HTN, carotid artery stenosis. PAT     History of Present Illness:  Kirsten Hogan is a 87 y.o. female who is being seen today for the evaluation of Syncope at the request of Lucianne Lei, MD.  This is a 87yo AAF with a hx of carotid artery stenosis, HTN, HLD who recently had a syncopal episode.  [place]  Her daughter dropped her off and then 2 hours later she got a call that her mom passed out.  Apparently she had been sitting and her head dropped down and she went limp and was unconscious.  They layed her down and could not feel a pulse and started CPR.  EMS was called but she came too before EMS got there.  When she awakened she did not feel bad and got up and walked to the EMT truck and was evaluated and everything was fine but her pulse was weak.  She did not want to go to the ER and went home.  Her daughter says that she had incontinence of urine at the time but no confusion.  She was seen by her PCP and EKG was fine and ordered a heart monitor.  She says that she felt fine prior to the event and was playing Bingo.  She denies any chest pain or pressure, SOB, DOE, LE edema, palpitations.  Past Medical History:  Diagnosis Date   Arthritis    Carotid artery stenosis    right    Cataract    NOS    Constipation    Decreased hearing    Glaucoma    NOS    Hyperlipidemia    Hypertension    Knee pain    right    Lumbosacral disc disease    spine    Melanoma ([facility])    Overactive bladder    Overweight(278.02)    Right thyroid nodule    goiter vs benign cyst on biopsy    Spondylosis without myelopathy    NOS    Tear of medial meniscus of knee, current     Past Surgical History:   Procedure Laterality Date   APPENDECTOMY     bladder tuck     CATARACT EXTRACTION     excision of facial skin cancer     EXPLORATORY LAPAROTOMY     Posterior interbody arthrodesis, L3-4, posterolateral athrodesis,,L3-4, pedicle screw fixation nonsegmental, S3-4 , with leagacy screws. Under  Dr. Deforest Hoyles on August 06, 2007     right knee Arthorscopy -dr. Aline Brochure -august 2008- menisceal tear     VESICOVAGINAL FISTULA CLOSURE W/ TAH     for excessive bleeding but no cancers     Current Medications: Current Meds  Medication Sig   aspirin 81 MG EC tablet Take 81 mg by mouth daily.     Calcium Carbonate-Vit D-Min (CALTRATE 600+D PLUS) 600-400 MG-UNIT per tablet Take 1 tablet by mouth 2 (two) times daily.     influenza vaccine adjuvanted (FLUAD) 0.5 ML injection Inject into the muscle.   LOSARTAN POTASSIUM PO Take 100 mg by mouth.     Multiple Vitamin (MULTIVITAMIN) capsule Take 1 capsule by mouth  daily.     Omega-3 Fatty Acids (FISH OIL) 1000 MG CAPS Take by mouth daily.     potassium chloride (KLOR-CON) 10 MEQ CR tablet Take 10 mEq by mouth daily.     pravastatin (PRAVACHOL) 20 MG tablet Take 20 mg by mouth daily.     predniSONE (STERAPRED UNI-PAK) 5 MG TABS tablet Take 1 tablet (5 mg total) by mouth as directed.   traMADol-acetaminophen (ULTRACET) 37.5-325 MG per tablet Take 1 tablet by mouth every 4 (four) hours as needed.   triamterene-hydrochlorothiazide (DYAZIDE) 37.5-25 MG per capsule Take 1 capsule by mouth daily.     Current Facility-Administered Medications for the 03/01/21 encounter (Office Visit) with Sueanne Margarita, MD  Medication   methylPREDNISolone acetate (DEPO-MEDROL) injection 40 mg    Allergies:   Patient has no known allergies.   Social History   Socioeconomic History   Marital status: Widowed    Spouse name: Not on file   Number of children: 2   Years of education: ba degree    Highest education level: Not on file  Occupational History   Occupation:  retired Education officer, [employer]   Tobacco Use   Smoking status: Never   Smokeless tobacco: Not on file  Substance and Sexual Activity   Alcohol use: No   Drug use: No   Sexual activity: Not on file  Other Topics Concern   Not on file  Social History Narrative   Not on file   Social Determinants of Health   Financial Resource Strain: Not on file  Food Insecurity: Not on file  Transportation Needs: Not on file  Physical Activity: Not on file  Stress: Not on file  Social Connections: Not on file     Family History:  The patient's family history includes Anuerysm in her mother; Cancer in her mother, sister, and unknown relative.   ROS:   Please see the history of present illness.    ROS All other systems reviewed and are negative.  No flowsheet data found.     PHYSICAL EXAM:   VS:  BP 128/70   Pulse 71   Ht 5' 6.5" (1.689 m)   Wt 133 lb (60.3 kg)   BMI 21.15 kg/m     Orthostatic VS for the past 24 hrs (Last 3 readings):  BP- Lying Pulse- Lying BP- Sitting Pulse- Sitting BP- Standing at 0 minutes Pulse- Standing at 0 minutes BP- Standing at 3 minutes Pulse- Standing at 3 minutes  03/01/21 1045 136/80 62 128/76 66 120/80 86 118/75 82    GEN: Well nourished, well developed, in no acute distress  HEENT: normal  Neck: no JVD, carotid bruits, or masses Cardiac: RRR; no murmurs, rubs, or gallops,no edema.  Intact distal pulses bilaterally.  Respiratory:  clear to auscultation bilaterally, normal work of breathing GI: soft, nontender, nondistended, + BS MS: no deformity or atrophy  Skin: warm and dry, no rash Neuro:  Alert and Oriented x 3, Strength and sensation are intact Psych: euthymic mood, full affect  Wt Readings from Last 3 Encounters:  03/01/21 133 lb (60.3 kg)  04/09/17 135 lb (61.2 kg)  09/27/13 155 lb (70.3 kg)      Studies/Labs Reviewed:   EKG:  EKG is ordered today.  The ekg ordered today demonstrates NSR with nonspecific ST/T wave abnormality, septal  infarct>>no prior EKG to review  Recent Labs: No results found for requested labs within last 8760 hours.   Lipid Panel    Component Value Date/Time  CHOL 200 12/28/2008 2058   TRIG 103 12/28/2008 2058   HDL 64 12/28/2008 2058   CHOLHDL 3.1 Ratio 12/28/2008 2058   VLDL 21 12/28/2008 2058   LDLCALC 115 (H) 12/28/2008 2058     Additional studies/ records that were reviewed today include:  EKG and event monitor    ASSESSMENT:    1. Syncope and collapse   2. Essential hypertension   3. Stenosis of right carotid artery      PLAN:  In order of problems listed above:  Syncope -unclear etiology>>favor vasovagal vs. Orthostatic (from diuretics and reduced fluid intake) vs. Hypoglycemia vs. arrhythmia -she had not sx prior to the event and felt fine after>>she was incontinent of urine (no seizure activity noted) -EKG shows septal infarct with nonspecific ST abnormality -she is borderline orthostatic on exam today with lood pressure dropping from 136/80 mmHg to 118/76 mmHg after standing 3 minutes with an increase in heart rate from 62 bpm to 82 bpm. -she has no fm hx of CAD -only CRFs include advanced age, HTN, HLD, carotid artery disease -heart monitor showed NSR with occasional PVCs, ventricular bigeminy and trigeminy, ventricular couplets -she has not had any further syncopal episodes -I will get a 2D echo to assess LVF -check carotid dopplers given known carotid stenosis -stop diuretic which can potentiate orthostasis and dehydration -We will give her prescription for compression hose to use during the day -Urged her to drink at least eight 8 ounce glasses of fluid a day and avoid caffeine -may need to consider ILR if she has recurrent syncope  2.  HTN -BP controlled on exam today -will stop Dyazide and get 48 hour BP monitor -check BMET today  3.  Carotid artery stenosis -check carotid dopplers -continue ASA and statin  4.  Paroxysmal atrial tachycardia/PVCs -she  is asymptomatic>>give her advanced age and that she is asymptomatic will not treat with meds as they could potentiate hypotension -she had no post termination pauses of monitor   Time Spent: 25 minutes total time of encounter, including 20 minutes spent in face-to-face patient care on the date of this encounter. This time includes coordination of care and counseling regarding above mentioned problem list. Remainder of non-face-to-face time involved reviewing chart documents/testing relevant to the patient encounter and documentation in the medical record. I have independently reviewed documentation from referring provider  Medication Adjustments/Labs and Tests Ordered: Current medicines are reviewed at length with the patient today.  Concerns regarding medicines are outlined above.  Medication changes, Labs and Tests ordered today are listed in the Patient Instructions below.  There are no Patient Instructions on file for this visit.   Signed, Fransico Him, MD  03/01/2021 10:33 AM    [facility] [address], [place], [place]  [address] Phone: [phone]; Fax: [phone]

## 2021-03-01 NOTE — Patient Instructions (Signed)
Medication Instructions:  Your physician has recommended you make the following change in your medication:  1) STOP taking Dyazide 2) STOP taking potassium   *If you need a refill on your cardiac medications before your next appointment, please call your pharmacy*  Lab Work: TODAY: BMET and magnesium  If you have labs (blood work) drawn today and your tests are completely normal, you will receive your results only by: [place] (if you have MyChart) OR A paper copy in the mail If you have any lab test that is abnormal or we need to change your treatment, we will call you to review the results.  Testing/Procedures: Your physician has requested that you have an echocardiogram. Echocardiography is a painless test that uses sound waves to create images of your heart. It provides your doctor with information about the size and shape of your heart and how well your heart's chambers and valves are working. This procedure takes approximately one hour. There are no restrictions for this procedure.  Your physician has requested that you have a carotid duplex. This test is an ultrasound of the carotid arteries in your neck. It looks at blood flow through these arteries that supply the brain with blood. Allow one hour for this exam. There are no restrictions or special instructions.  Follow-Up: At [facility], you and your health needs are our priority.  As part of our continuing mission to provide you with exceptional heart care, we have created designated Provider Care Teams.  These Care Teams include your primary Cardiologist (physician) and Advanced Practice Providers (APPs -  Physician Assistants and Nurse Practitioners) who all work together to provide you with the care you need, when you need it.  Your next appointment:   3 month(s)  The format for your next appointment:   In Person  Provider:   You may see Fransico Him, MD or one of the following Advanced Practice Providers on your  designated Care Team:   [place], PA-C  Ermalinda Barrios, [place]

## 2021-03-01 NOTE — Addendum Note (Signed)
Addended by: Antonieta Iba on: 03/01/2021 11:22 AM   Modules accepted: Orders

## 2021-03-04 ENCOUNTER — Telehealth: Payer: Self-pay

## 2021-03-04 DIAGNOSIS — R55 Syncope and collapse: Secondary | ICD-10-CM

## 2021-03-04 DIAGNOSIS — I10 Essential (primary) hypertension: Secondary | ICD-10-CM

## 2021-03-04 NOTE — Telephone Encounter (Signed)
The patient has been notified of the result and verbalized understanding.  All questions (if any) were answered. Antonieta Iba, RN 03/04/2021 3:35 PM  Labs have been ordered.

## 2021-03-04 NOTE — Telephone Encounter (Signed)
-----  Message from Traci R Turner, MD sent at 03/01/2021  2:43 PM EDT ----- Potassium borderline low but Dyazide just stopped.  Please get a be met in 1 week 

## 2021-03-08 ENCOUNTER — Other Ambulatory Visit: Payer: Self-pay

## 2021-03-08 ENCOUNTER — Ambulatory Visit (HOSPITAL_COMMUNITY)
Admission: RE | Admit: 2021-03-08 | Discharge: 2021-03-08 | Disposition: A | Discharge status: Home / Self Care | Payer: Medicare PPO | Source: Ambulatory Visit | Attending: Cardiology | Admitting: Cardiology

## 2021-03-08 ENCOUNTER — Other Ambulatory Visit (HOSPITAL_COMMUNITY)
Admission: RE | Admit: 2021-03-08 | Discharge: 2021-03-08 | Disposition: A | Discharge status: Home / Self Care | Payer: Medicare PPO | Source: Ambulatory Visit | Attending: Cardiology | Admitting: Cardiology

## 2021-03-08 DIAGNOSIS — R55 Syncope and collapse: Secondary | ICD-10-CM | POA: Insufficient documentation

## 2021-03-08 DIAGNOSIS — I10 Essential (primary) hypertension: Secondary | ICD-10-CM

## 2021-03-08 DIAGNOSIS — I6523 Occlusion and stenosis of bilateral carotid arteries: Secondary | ICD-10-CM | POA: Diagnosis not present

## 2021-03-08 DIAGNOSIS — I6521 Occlusion and stenosis of right carotid artery: Secondary | ICD-10-CM | POA: Insufficient documentation

## 2021-03-08 LAB — BASIC METABOLIC PANEL
Anion gap: 6 (ref 5–15)
BUN: 16 mg/dL (ref 8–23)
CO2: 31 mmol/L (ref 22–32)
Calcium: 9.2 mg/dL (ref 8.9–10.3)
Chloride: 103 mmol/L (ref 98–111)
Creatinine, Ser: 0.77 mg/dL (ref 0.44–1.00)
GFR, Estimated: >60 mL/min (ref >60)
Glucose, Bld: 126 mg/dL — ABNORMAL HIGH (ref 70–99)
Potassium: 3.7 mmol/L (ref 3.5–5.1)
Sodium: 140 mmol/L (ref 135–145)

## 2021-03-13 ENCOUNTER — Encounter: Payer: Self-pay | Admitting: Cardiology

## 2021-03-13 DIAGNOSIS — I6529 Occlusion and stenosis of unspecified carotid artery: Secondary | ICD-10-CM | POA: Insufficient documentation

## 2021-03-18 DIAGNOSIS — M150 Primary generalized (osteo)arthritis: Secondary | ICD-10-CM | POA: Diagnosis not present

## 2021-03-18 DIAGNOSIS — I10 Essential (primary) hypertension: Secondary | ICD-10-CM | POA: Diagnosis not present

## 2021-03-18 DIAGNOSIS — E785 Hyperlipidemia, unspecified: Secondary | ICD-10-CM | POA: Diagnosis not present

## 2021-04-04 DIAGNOSIS — Z23 Encounter for immunization: Secondary | ICD-10-CM | POA: Diagnosis not present

## 2021-04-11 ENCOUNTER — Other Ambulatory Visit: Payer: Self-pay

## 2021-04-11 ENCOUNTER — Ambulatory Visit (HOSPITAL_COMMUNITY)
Admission: RE | Admit: 2021-04-11 | Discharge: 2021-04-11 | Disposition: A | Discharge status: Home / Self Care | Payer: Medicare PPO | Source: Ambulatory Visit | Attending: Cardiology | Admitting: Cardiology

## 2021-04-11 DIAGNOSIS — I6521 Occlusion and stenosis of right carotid artery: Secondary | ICD-10-CM | POA: Diagnosis not present

## 2021-04-11 DIAGNOSIS — R55 Syncope and collapse: Secondary | ICD-10-CM | POA: Diagnosis not present

## 2021-04-11 DIAGNOSIS — I10 Essential (primary) hypertension: Secondary | ICD-10-CM | POA: Diagnosis not present

## 2021-04-11 LAB — ECHOCARDIOGRAM COMPLETE
Area-P 1/2: 2.95 cm2
S' Lateral: 1.8 cm

## 2021-04-11 NOTE — Progress Notes (Signed)
*  PRELIMINARY RESULTS* Echocardiogram 2D Echocardiogram has been performed.  Kirsten Hogan 04/11/2021, 9:16 AM

## 2021-04-24 DIAGNOSIS — L218 Other seborrheic dermatitis: Secondary | ICD-10-CM | POA: Diagnosis not present

## 2021-04-25 DIAGNOSIS — H409 Unspecified glaucoma: Secondary | ICD-10-CM | POA: Diagnosis not present

## 2021-04-25 DIAGNOSIS — Z823 Family history of stroke: Secondary | ICD-10-CM | POA: Diagnosis not present

## 2021-04-25 DIAGNOSIS — Z7982 Long term (current) use of aspirin: Secondary | ICD-10-CM | POA: Diagnosis not present

## 2021-04-25 DIAGNOSIS — Z7409 Other reduced mobility: Secondary | ICD-10-CM | POA: Diagnosis not present

## 2021-04-25 DIAGNOSIS — E785 Hyperlipidemia, unspecified: Secondary | ICD-10-CM | POA: Diagnosis not present

## 2021-04-25 DIAGNOSIS — I10 Essential (primary) hypertension: Secondary | ICD-10-CM | POA: Diagnosis not present

## 2021-04-25 DIAGNOSIS — Z803 Family history of malignant neoplasm of breast: Secondary | ICD-10-CM | POA: Diagnosis not present

## 2021-05-28 NOTE — Progress Notes (Signed)
Cardiology Office Note    Date:  05/29/2021   ID:  Adore, Kithcart [birth year], MRN [id number]  PCP:  Iona Beard, MD  Cardiologist: Fransico Him, MD    Chief Complaint  Patient presents with   Follow-up    6 month visit    History of Present Illness:    Kirsten Hogan is a 88 y.o. female with past medical history of HTN, HLD, carotid artery stenosis and back pain who presents to the office today for 83-month follow-up.  She was last examined by Dr. Radford Pax in 02/2021 following a recent syncopal episode. She had been evaluated by EMS at the time of her episode and declined ER evaluation. She denied any prodromal symptoms and had not experienced any recurrent syncopal episodes following the event. Her PCP had ordered an event monitor which showed predominantly normal sinus rhythm with PAC's representing 2.7% of total beats and 6 brief episodes of SVT with the longest lasting 13 beats. Dr. Radford Pax recommended obtaining an echocardiogram along with carotid dopplers.  She was also encouraged to increase her fluid consumption and wear compression stockings. Her diuretic was discontinued (Triamterene-HCTZ). Carotid dopplers showed moderate plaque but no hemodynamically significant stenosis. Echocardiogram showed a preserved EF of 60 to 65% with no regional wall motion abnormalities. She did have grade 1 diastolic dysfunction and severe LVH of the basal septal segment. RV function was normal and she did not have any significant valve abnormalities.  In talking with the patient and her daughter today, she reports overall doing well since her last office visit. She does continue to live by herself but her daughter lives close by and she spends part of the day with her. She denies any recurrent syncopal episodes. No recent chest pain or dyspnea on exertion. No recent orthopnea or PND. She does experience occasional lower extremity edema but reports symptoms have improved since using compression stockings.  She did try increasing her fluid intake after her last visit and is consuming approximately 60 ounces of fluid daily.   Past Medical History:  Diagnosis Date   Arthritis    Carotid artery stenosis    Moderate homogenous plaque in the right and left carotid artery with no significant stenosis by Dopplers 03/2021   Cataract    NOS    Constipation    Decreased hearing    Glaucoma    NOS    Hyperlipidemia    Hypertension    Knee pain    right    Lumbosacral disc disease    spine    Melanoma (HCC)    Overactive bladder    Overweight(278.02)    Right thyroid nodule    goiter vs benign cyst on biopsy    Spondylosis without myelopathy    NOS    Tear of medial meniscus of knee, current     Past Surgical History:  Procedure Laterality Date   APPENDECTOMY     bladder tuck     CATARACT EXTRACTION     excision of facial skin cancer     EXPLORATORY LAPAROTOMY     Posterior interbody arthrodesis, L3-4, posterolateral athrodesis,,L3-4, pedicle screw fixation nonsegmental, S3-4 , with leagacy screws. Under  Dr. Deforest Hoyles on August 06, 2007     right knee Arthorscopy -dr. Aline Brochure -august 2008- menisceal tear     VESICOVAGINAL FISTULA CLOSURE W/ TAH     for excessive bleeding but no cancers     Current Medications: Outpatient Medications Prior to Visit  Medication Sig  Dispense Refill   aspirin 81 MG EC tablet Take 81 mg by mouth daily.       Calcium Carbonate-Vit D-Min (CALTRATE 600+D PLUS) 600-400 MG-UNIT per tablet Take 1 tablet by mouth 2 (two) times daily.       influenza vaccine adjuvanted (FLUAD) 0.5 ML injection Inject into the muscle. 0.5 mL 0   LOSARTAN POTASSIUM PO Take 100 mg by mouth.       Multiple Vitamin (MULTIVITAMIN) capsule Take 1 capsule by mouth daily.       pravastatin (PRAVACHOL) 20 MG tablet Take 20 mg by mouth daily.       Omega-3 Fatty Acids (FISH OIL) 1000 MG CAPS Take by mouth daily.   (Patient not taking: Reported on 05/29/2021)     predniSONE (STERAPRED  UNI-PAK) 5 MG TABS tablet Take 1 tablet (5 mg total) by mouth as directed. (Patient not taking: Reported on 05/29/2021) 48 tablet 0   traMADol-acetaminophen (ULTRACET) 37.5-325 MG per tablet Take 1 tablet by mouth every 4 (four) hours as needed. (Patient not taking: Reported on 05/29/2021) 30 tablet 5   Facility-Administered Medications Prior to Visit  Medication Dose Route Frequency Provider Last Rate Last Admin   methylPREDNISolone acetate (DEPO-MEDROL) injection 40 mg  40 mg Intra-articular Once Carole Civil, MD         Allergies:   Patient has no known allergies.   Social History   Socioeconomic History   Marital status: Widowed    Spouse name: Not on file   Number of children: 2   Years of education: ba degree    Highest education level: Not on file  Occupational History   Occupation: retired Education officer, museum   Tobacco Use   Smoking status: Never   Smokeless tobacco: Not on file  Vaping Use   Vaping Use: Never used  Substance and Sexual Activity   Alcohol use: No   Drug use: No   Sexual activity: Not on file  Other Topics Concern   Not on file  Social History Narrative   Not on file   Social Determinants of Health   Financial Resource Strain: Not on file  Food Insecurity: Not on file  Transportation Needs: Not on file  Physical Activity: Not on file  Stress: Not on file  Social Connections: Not on file     Family History:  The patient's family history includes Anuerysm in her mother; Cancer in her mother, sister, and unknown relative.   Review of Systems:    Please see the history of present illness.     All other systems reviewed and are otherwise negative except as noted above.   Physical Exam:    VS:  BP 130/80    Pulse 63    Ht 5' 6.5" (1.689 m)    Wt 137 lb 12.8 oz (62.5 kg)    SpO2 98%    BMI 21.91 kg/m    General: Pleasant elderly female appearing in no acute distress. Head: Normocephalic, atraumatic. Neck: No carotid bruits. JVD not elevated.   Lungs: Respirations regular and unlabored, without wheezes or rales.  Heart: Regular rate and rhythm. No S3 or S4.  No murmur, no rubs, or gallops appreciated. Abdomen: Appears non-distended. No obvious abdominal masses. Msk:  Strength and tone appear normal for age. No obvious joint deformities or effusions. Extremities: No clubbing or cyanosis. No pitting edema. Compression stockings in place.  Distal pedal pulses are 2+ bilaterally. Neuro: Alert and oriented X 3. Moves all extremities spontaneously.  No focal deficits noted. Psych:  Responds to questions appropriately with a normal affect. Skin: No rashes or lesions noted  Wt Readings from Last 3 Encounters:  05/29/21 137 lb 12.8 oz (62.5 kg)  03/01/21 133 lb (60.3 kg)  04/09/17 135 lb (61.2 kg)     Studies/Labs Reviewed:   EKG:  EKG is not ordered today.   Recent Labs: 03/01/2021: Magnesium 1.9 03/08/2021: BUN 16; Creatinine, Ser 0.77; Potassium 3.7; Sodium 140   Lipid Panel    Component Value Date/Time   CHOL 200 12/28/2008 2058   TRIG 103 12/28/2008 2058   HDL 64 12/28/2008 2058   CHOLHDL 3.1 Ratio 12/28/2008 2058   VLDL 21 12/28/2008 2058   LDLCALC 115 (H) 12/28/2008 2058    Additional studies/ records that were reviewed today include:   Event Monitor: 02/2021 ZIO XT reviewed.  2 days, 10 hours analyzed.  Predominant rhythm is sinus with prolonged PR interval, heart rate ranging from 52 bpm up to 124 bpm with average heart rate 67 bpm.  There were occasional PACs representing 2.7% total beats with otherwise rare atrial couplets and triplets.  There were also occasional PVCs representing 1.3% total beats with otherwise rare couplets as well as brief episodes of bigeminy and trigeminy.  There were 6 episodes of brief PSVT, the longest of which lasted 13 beats with average heart rate in the 130s.  There were no sustained atrial or ventricular arrhythmias, no pauses.  Carotid Dopplers: 03/2021 IMPRESSION: Color duplex  indicates moderate homogeneous plaque, with no hemodynamically significant stenosis by duplex criteria in the extracranial cerebrovascular circulation.  Echocardiogram: 04/2021 IMPRESSIONS     1. Left ventricular ejection fraction, by estimation, is 60 to 65%. The  left ventricle has normal function. The left ventricle has no regional  wall motion abnormalities. There is severe left ventricular hypertrophy of  the basal-septal segment. Left  ventricular diastolic parameters are consistent with Grade I diastolic  dysfunction (impaired relaxation).   2. Right ventricular systolic function is normal. The right ventricular  size is normal. There is normal pulmonary artery systolic pressure.   3. The mitral valve is normal in structure. No evidence of mitral valve  regurgitation. No evidence of mitral stenosis.   4. The aortic valve is tricuspid. Aortic valve regurgitation is not  visualized. No aortic stenosis is present.   5. The inferior vena cava is normal in size with greater than 50%  respiratory variability, suggesting right atrial pressure of 3 mmHg.    Assessment:    1. Syncope and collapse   2. Stenosis of right carotid artery   3. Essential hypertension      Plan:   In order of problems listed above:  1. Prior Syncopal Episode - This occurred last year and her monitor at that time showed PAC's and brief SVT but no significant arrhythmias or pauses and her echocardiogram and carotid dopplers were reassuring at that time. - It was felt her episode was possibly secondary to dehydration and her diuretic was discontinued and she has increased her fluid consumption since. Given no recurrent episodes, no indication for further testing at this time. I did recommend the continued use of compression stockings and she can use knee-high stockings if these are more comfortable for her.  2. Carotid Artery Stenosis - Dopplers in 03/2021 showed moderate homogenous plaque with no  hemodynamically significant stenosis. Remains on ASA 81 mg daily and Pravastatin 20 mg daily.  3. HTN - Her blood pressure is well controlled  at 130/80 during today's visit. Continue Losartan 100 mg daily.   Medication Adjustments/Labs and Tests Ordered: Current medicines are reviewed at length with the patient today.  Concerns regarding medicines are outlined above.  Medication changes, Labs and Tests ordered today are listed in the Patient Instructions below. Patient Instructions  Medication Instructions:  Your physician recommends that you continue on your current medications as directed. Please refer to the Current Medication list given to you today.  *If you need a refill on your cardiac medications before your next appointment, please call your pharmacy*   If you have labs (blood work) drawn today and your tests are completely normal, you will receive your results only by: [place] (if you have MyChart) OR A paper copy in the mail If you have any lab test that is abnormal or we need to change your treatment, we will call you to review the results.  Follow-Up: At [facility], you and your health needs are our priority.  As part of our continuing mission to provide you with exceptional heart care, we have created designated Provider Care Teams.  These Care Teams include your primary Cardiologist (physician) and Advanced Practice Providers (APPs -  Physician Assistants and Nurse Practitioners) who all work together to provide you with the care you need, when you need it.  We recommend signing up for the patient portal called "MyChart".  Sign up information is provided on this After Visit Summary.  MyChart is used to connect with patients for Virtual Visits (Telemedicine).  Patients are able to view lab/test results, encounter notes, upcoming appointments, etc.  Non-urgent messages can be sent to your provider as well.   To learn more about what you can do with MyChart, go to  [email or web address].    Your next appointment:   1 year(s)  The format for your next appointment:   In Person  Provider:   You may see Fransico Him, MD or one of the following Advanced Practice Providers on your designated Care Team:   Bernerd Pho, PA-C  Ermalinda Barrios, PA-C     Other Instructions Thank you for choosing [facility]!      Signed, Erma Heritage, PA-C  05/29/2021 4:01 PM    [facility] [address] [place], [place] [address] Phone: [phone] Fax: [phone]

## 2021-05-29 ENCOUNTER — Other Ambulatory Visit: Payer: Self-pay

## 2021-05-29 ENCOUNTER — Encounter: Payer: Self-pay | Admitting: Student

## 2021-05-29 ENCOUNTER — Ambulatory Visit: Payer: Medicare PPO | Admitting: Student

## 2021-05-29 VITALS — BP 130/80 | HR 63 | Ht 66.5 in | Wt 137.8 lb

## 2021-05-29 DIAGNOSIS — I6521 Occlusion and stenosis of right carotid artery: Secondary | ICD-10-CM | POA: Diagnosis not present

## 2021-05-29 DIAGNOSIS — R55 Syncope and collapse: Secondary | ICD-10-CM | POA: Diagnosis not present

## 2021-05-29 DIAGNOSIS — I10 Essential (primary) hypertension: Secondary | ICD-10-CM

## 2021-05-29 NOTE — Patient Instructions (Signed)
Medication Instructions:  Your physician recommends that you continue on your current medications as directed. Please refer to the Current Medication list given to you today.  *If you need a refill on your cardiac medications before your next appointment, please call your pharmacy*   If you have labs (blood work) drawn today and your tests are completely normal, you will receive your results only by: [place] (if you have MyChart) OR A paper copy in the mail If you have any lab test that is abnormal or we need to change your treatment, we will call you to review the results.  Follow-Up: At [facility], you and your health needs are our priority.  As part of our continuing mission to provide you with exceptional heart care, we have created designated Provider Care Teams.  These Care Teams include your primary Cardiologist (physician) and Advanced Practice Providers (APPs -  Physician Assistants and Nurse Practitioners) who all work together to provide you with the care you need, when you need it.  We recommend signing up for the patient portal called "MyChart".  Sign up information is provided on this After Visit Summary.  MyChart is used to connect with patients for Virtual Visits (Telemedicine).  Patients are able to view lab/test results, encounter notes, upcoming appointments, etc.  Non-urgent messages can be sent to your provider as well.   To learn more about what you can do with MyChart, go to [email or web address].    Your next appointment:   1 year(s)  The format for your next appointment:   In Person  Provider:   You may see Fransico Him, MD or one of the following Advanced Practice Providers on your designated Care Team:   Bernerd Pho, PA-C  Ermalinda Barrios, PA-C     Other Instructions Thank you for choosing [facility]!

## 2021-06-18 DIAGNOSIS — E785 Hyperlipidemia, unspecified: Secondary | ICD-10-CM | POA: Diagnosis not present

## 2021-06-18 DIAGNOSIS — I10 Essential (primary) hypertension: Secondary | ICD-10-CM | POA: Diagnosis not present

## 2021-06-18 DIAGNOSIS — M150 Primary generalized (osteo)arthritis: Secondary | ICD-10-CM | POA: Diagnosis not present

## 2021-07-25 DIAGNOSIS — H401122 Primary open-angle glaucoma, left eye, moderate stage: Secondary | ICD-10-CM | POA: Diagnosis not present

## 2021-09-23 DIAGNOSIS — M150 Primary generalized (osteo)arthritis: Secondary | ICD-10-CM | POA: Diagnosis not present

## 2021-09-23 DIAGNOSIS — I10 Essential (primary) hypertension: Secondary | ICD-10-CM | POA: Diagnosis not present

## 2021-09-23 DIAGNOSIS — E785 Hyperlipidemia, unspecified: Secondary | ICD-10-CM | POA: Diagnosis not present

## 2021-12-02 ENCOUNTER — Other Ambulatory Visit (HOSPITAL_COMMUNITY): Payer: Self-pay | Admitting: Family Medicine

## 2021-12-02 ENCOUNTER — Ambulatory Visit (HOSPITAL_COMMUNITY)
Admission: RE | Admit: 2021-12-02 | Discharge: 2021-12-02 | Disposition: A | Discharge status: Home / Self Care | Payer: Medicare PPO | Source: Ambulatory Visit | Attending: Family Medicine | Admitting: Family Medicine

## 2021-12-02 DIAGNOSIS — M150 Primary generalized (osteo)arthritis: Secondary | ICD-10-CM | POA: Diagnosis not present

## 2021-12-02 DIAGNOSIS — E785 Hyperlipidemia, unspecified: Secondary | ICD-10-CM | POA: Diagnosis not present

## 2021-12-02 DIAGNOSIS — I10 Essential (primary) hypertension: Secondary | ICD-10-CM | POA: Diagnosis not present

## 2021-12-02 DIAGNOSIS — Z0000 Encounter for general adult medical examination without abnormal findings: Secondary | ICD-10-CM | POA: Diagnosis not present

## 2021-12-02 DIAGNOSIS — M545 Low back pain, unspecified: Secondary | ICD-10-CM | POA: Insufficient documentation

## 2021-12-02 DIAGNOSIS — Z6822 Body mass index (BMI) 22.0-22.9, adult: Secondary | ICD-10-CM | POA: Diagnosis not present

## 2021-12-07 ENCOUNTER — Emergency Department (HOSPITAL_COMMUNITY)
Admission: EM | Admit: 2021-12-07 | Discharge: 2021-12-08 | Disposition: A | Discharge status: Home / Self Care | Payer: Medicare PPO | Attending: Emergency Medicine | Admitting: Emergency Medicine

## 2021-12-07 ENCOUNTER — Encounter (HOSPITAL_COMMUNITY): Payer: Self-pay | Admitting: *Deleted

## 2021-12-07 ENCOUNTER — Other Ambulatory Visit: Payer: Self-pay

## 2021-12-07 DIAGNOSIS — I10 Essential (primary) hypertension: Secondary | ICD-10-CM | POA: Diagnosis not present

## 2021-12-07 DIAGNOSIS — R109 Unspecified abdominal pain: Secondary | ICD-10-CM | POA: Diagnosis present

## 2021-12-07 DIAGNOSIS — K5900 Constipation, unspecified: Secondary | ICD-10-CM | POA: Diagnosis not present

## 2021-12-07 MED ORDER — BISACODYL 10 MG RE SUPP: 10.0000 mg | RECTAL | 0 refills | Status: AC | PRN

## 2021-12-07 MED ORDER — POLYETHYLENE GLYCOL 3350 17 G PO PACK: 17.0000 g | PACK | Freq: Every day | ORAL | 1 refills | Status: AC

## 2021-12-07 NOTE — Discharge Instructions (Signed)
You were evaluated in the Emergency Department and after careful evaluation, we did not find any emergent condition requiring admission or further testing in the hospital.  Your exam/testing today is overall reassuring.  Recommend continued use of MiraLAX, Dulcolax suppositories, enemas at home.  Please return to the Emergency Department if you experience any worsening of your condition.   Thank you for allowing [place] to be a part of your care.

## 2021-12-07 NOTE — ED Notes (Signed)
Pt family states she passe a large bowel movement, but they can still fill hard stool as pt tries to defecate. Pt states she feels a lot better now

## 2021-12-07 NOTE — ED Triage Notes (Signed)
Pt with constipation, daughter gave an enema without success.  LBM ?Wednesday or Thursday.  Denies any pain.

## 2021-12-07 NOTE — ED Provider Notes (Signed)
[facility] Emergency Department Provider Note MRN:  [id number]  Arrival date & time: 12/07/21     Chief Complaint   Constipation   History of Present Illness   Kirsten Hogan is a 88 y.o. year-old female with a history of hypertension, presenting to the ED with chief complaint of constipation.  Unable to have bowel movement for the past few days, having some abdominal discomfort and bloating today, enema did not work, brought here for evaluation.  Review of Systems  A thorough review of systems was obtained and all systems are negative except as noted in the HPI and PMH.   Patient's Health History    Past Medical History:  Diagnosis Date   Arthritis    Carotid artery stenosis    Moderate homogenous plaque in the right and left carotid artery with no significant stenosis by Dopplers 03/2021   Cataract    NOS    Constipation    Decreased hearing    Glaucoma    NOS    Hyperlipidemia    Hypertension    Knee pain    right    Lumbosacral disc disease    spine    Melanoma (HCC)    Overactive bladder    Overweight(278.02)    Right thyroid nodule    goiter vs benign cyst on biopsy    Spondylosis without myelopathy    NOS    Tear of medial meniscus of knee, current     Past Surgical History:  Procedure Laterality Date   APPENDECTOMY     bladder tuck     CATARACT EXTRACTION     excision of facial skin cancer     EXPLORATORY LAPAROTOMY     Posterior interbody arthrodesis, L3-4, posterolateral athrodesis,,L3-4, pedicle screw fixation nonsegmental, S3-4 , with leagacy screws. Under  Dr. Deforest Hoyles on August 06, 2007     right knee Arthorscopy -dr. Aline Brochure -august 2008- menisceal tear     VESICOVAGINAL FISTULA CLOSURE W/ TAH     for excessive bleeding but no cancers     Family History  Problem Relation Age of Onset   Cancer Mother        colon    Anuerysm Mother        brain    Cancer Sister        lung    Cancer Unknown        fx     Social  History   Socioeconomic History   Marital status: Widowed    Spouse name: Not on file   Number of children: 2   Years of education: ba degree    Highest education level: Not on file  Occupational History   Occupation: retired Education officer, museum   Tobacco Use   Smoking status: Never   Smokeless tobacco: Not on file  Vaping Use   Vaping Use: Never used  Substance and Sexual Activity   Alcohol use: No   Drug use: No   Sexual activity: Not on file  Other Topics Concern   Not on file  Social History Narrative   Not on file   Social Determinants of Health   Financial Resource Strain: Not on file  Food Insecurity: Not on file  Transportation Needs: Not on file  Physical Activity: Not on file  Stress: Not on file  Social Connections: Not on file  Intimate Partner Violence: Not on file     Physical Exam   Vitals:   12/07/21 2133  BP: Marland Kitchen)  144/91  Pulse: 94  Resp: 14  Temp: 97.6 F (36.4 C)  SpO2: 96%    CONSTITUTIONAL: Well-appearing, NAD NEURO/PSYCH:  Alert and oriented x 3, no focal deficits EYES:  eyes equal and reactive ENT/NECK:  no LAD, no JVD CARDIO: Regular rate, well-perfused, normal S1 and S2 PULM:  CTAB no wheezing or rhonchi GI/GU:  non-distended, non-tender MSK/SPINE:  No gross deformities, no edema SKIN:  no rash, atraumatic   *Additional and/or pertinent findings included in MDM below  Diagnostic and Interventional Summary    EKG Interpretation  Date/Time:    Ventricular Rate:    PR Interval:    QRS Duration:   QT Interval:    QTC Calculation:   R Axis:     Text Interpretation:         Labs Reviewed - No data to display  No orders to display    Medications - No data to display   Procedures  /  Critical Care Fecal disimpaction  Date/Time: 12/07/2021 11:56 PM  Performed by: Maudie Flakes, MD Authorized by: Maudie Flakes, MD  Consent: Verbal consent obtained. Risks and benefits: risks, benefits and alternatives were  discussed Consent given by: patient Patient understanding: patient states understanding of the procedure being performed Patient identity confirmed: verbally with patient Time out: Immediately prior to procedure a "time out" was called to verify the correct patient, procedure, equipment, support staff and site/side marked as required. Local anesthesia used: no  Anesthesia: Local anesthesia used: no  Sedation: Patient sedated: no  Patient tolerance: patient tolerated the procedure well with no immediate complications Comments: Firm stool palpated in the rectal vault, separated from the wall of the rectum with digital palpation.  Patient requested termination of procedure due to discomfort.     ED Course and Medical Decision Making  Initial Impression and Ddx Patient feeling much better after bowel movement, abdomen soft and nontender.  Currently without complaints.  Her daughter explains that she still has a firm stool in the rectal vault, they are requesting further intervention.  Fecal disimpaction attempted, see above for procedural details.  She is appropriate for further management of constipation at home.  Past medical/surgical history that increases complexity of ED encounter: None  Interpretation of Diagnostics Not applicable  Patient Reassessment and Ultimate Disposition/Management     Discharge  Patient management required discussion with the following services or consulting groups:  None  Complexity of Problems Addressed Acute illness or injury that poses threat of life of bodily function  Additional Data Reviewed and Analyzed Further history obtained from: Further history from spouse/family member  Additional Factors Impacting ED Encounter Risk Prescriptions  Barth Kirks. Sedonia Small, MD [place] [email or web address]  Final Clinical Impressions(s) / ED Diagnoses     ICD-10-CM   1. Constipation, unspecified  constipation type  K59.00       ED Discharge Orders          Ordered    polyethylene glycol (MIRALAX) 17 g packet  Daily        12/07/21 2355    bisacodyl (DULCOLAX) 10 MG suppository  As needed        12/07/21 2355             Discharge Instructions Discussed with and Provided to Patient:     Discharge Instructions      You were evaluated in the Emergency Department and after careful evaluation, we did not find any emergent condition requiring admission  or further testing in the hospital.  Your exam/testing today is overall reassuring.  Recommend continued use of MiraLAX, Dulcolax suppositories, enemas at home.  Please return to the Emergency Department if you experience any worsening of your condition.   Thank you for allowing [place] to be a part of your care.       Maudie Flakes, MD 12/07/21 [phone]

## 2021-12-09 DIAGNOSIS — H02403 Unspecified ptosis of bilateral eyelids: Secondary | ICD-10-CM | POA: Diagnosis not present

## 2021-12-09 DIAGNOSIS — H1089 Other conjunctivitis: Secondary | ICD-10-CM | POA: Diagnosis not present

## 2021-12-10 DIAGNOSIS — H02403 Unspecified ptosis of bilateral eyelids: Secondary | ICD-10-CM | POA: Diagnosis not present

## 2022-02-07 DIAGNOSIS — Z23 Encounter for immunization: Secondary | ICD-10-CM | POA: Diagnosis not present

## 2022-03-10 DIAGNOSIS — H353132 Nonexudative age-related macular degeneration, bilateral, intermediate dry stage: Secondary | ICD-10-CM | POA: Diagnosis not present

## 2022-04-22 DIAGNOSIS — I10 Essential (primary) hypertension: Secondary | ICD-10-CM | POA: Diagnosis not present

## 2022-04-22 DIAGNOSIS — E785 Hyperlipidemia, unspecified: Secondary | ICD-10-CM | POA: Diagnosis not present

## 2022-04-22 DIAGNOSIS — M545 Low back pain, unspecified: Secondary | ICD-10-CM | POA: Diagnosis not present

## 2022-06-25 ENCOUNTER — Encounter: Payer: Self-pay | Admitting: Internal Medicine

## 2022-06-25 ENCOUNTER — Ambulatory Visit: Payer: Medicare PPO | Attending: Internal Medicine | Admitting: Internal Medicine

## 2022-06-25 VITALS — BP 122/70 | HR 71 | Ht 66.5 in | Wt 138.6 lb

## 2022-06-25 DIAGNOSIS — R55 Syncope and collapse: Secondary | ICD-10-CM

## 2022-06-25 DIAGNOSIS — I10 Essential (primary) hypertension: Secondary | ICD-10-CM | POA: Diagnosis not present

## 2022-06-25 NOTE — Patient Instructions (Signed)
Medication Instructions:  Your physician recommends that you continue on your current medications as directed. Please refer to the Current Medication list given to you today.  *If you need a refill on your cardiac medications before your next appointment, please call your pharmacy*   Lab Work: NONE   If you have labs (blood work) drawn today and your tests are completely normal, you will receive your results only by: [place] (if you have MyChart) OR A paper copy in the mail If you have any lab test that is abnormal or we need to change your treatment, we will call you to review the results.   Testing/Procedures: NONE    Follow-Up: At [facility], you and your health needs are our priority.  As part of our continuing mission to provide you with exceptional heart care, we have created designated Provider Care Teams.  These Care Teams include your primary Cardiologist (physician) and Advanced Practice Providers (APPs -  Physician Assistants and Nurse Practitioners) who all work together to provide you with the care you need, when you need it.  We recommend signing up for the patient portal called "MyChart".  Sign up information is provided on this After Visit Summary.  MyChart is used to connect with patients for Virtual Visits (Telemedicine).  Patients are able to view lab/test results, encounter notes, upcoming appointments, etc.  Non-urgent messages can be sent to your provider as well.   To learn more about what you can do with MyChart, go to [email or web address].    Your next appointment:   2 year(s)  Provider:   Claudina Lick, MD    Other Instructions Thank you for choosing [facility]!

## 2022-06-25 NOTE — Progress Notes (Signed)
Cardiology Office Note  Date: 06/25/2022   ID: Kirsten Hogan, Kirsten Hogan 1924/01/17, MRN [id number]  PCP:  Iona Beard, MD  Cardiologist:  Fransico Him, MD Electrophysiologist:  None   Reason for Office Visit: Syncopal episode follow-up   History of Present Illness: Kirsten Hogan is a 89 y.o. female known to have HTN, HLD, carotid artery stenosis moderate presents to cardiology clinic for follow-up visit.  Patient was consulted for syncopal episode in 02/2021 for which event monitor was performed and showed 2.7% PAC, 6 brief episodes of SVT longest lasting 13 beats and no evidence of conduction system abnormalities. Echocardiogram showed normal LVEF 60 to 65% with no RWMA, G1 DD and severe LVH with basal septal segment. No significant valve abnormalities. After holding her diuretic and increasing p.o. fluid intake, her syncopal episodes resolved completely. She is here for follow-up visit. No symptoms of syncope, dizziness, pre-syncope, palpitations, chest pain or DOE. Independent. Daughter lives close by.  Past Medical History:  Diagnosis Date   Arthritis    Carotid artery stenosis    Moderate homogenous plaque in the right and left carotid artery with no significant stenosis by Dopplers 03/2021   Cataract    NOS    Constipation    Decreased hearing    Glaucoma    NOS    Hyperlipidemia    Hypertension    Knee pain    right    Lumbosacral disc disease    spine    Melanoma (HCC)    Overactive bladder    Overweight(278.02)    Right thyroid nodule    goiter vs benign cyst on biopsy    Spondylosis without myelopathy    NOS    Tear of medial meniscus of knee, current     Past Surgical History:  Procedure Laterality Date   APPENDECTOMY     bladder tuck     CATARACT EXTRACTION     excision of facial skin cancer     EXPLORATORY LAPAROTOMY     Posterior interbody arthrodesis, L3-4, posterolateral athrodesis,,L3-4, pedicle screw fixation nonsegmental, S3-4 , with leagacy screws.  Under  Dr. Deforest Hoyles on August 06, 2007     right knee Arthorscopy -dr. Aline Brochure -august 2008- menisceal tear     VESICOVAGINAL FISTULA CLOSURE W/ TAH     for excessive bleeding but no cancers     Current Outpatient Medications  Medication Sig Dispense Refill   aspirin 81 MG EC tablet Take 81 mg by mouth daily.       Calcium Carbonate-Vit D-Min (CALTRATE 600+D PLUS) 600-400 MG-UNIT per tablet Take 1 tablet by mouth 2 (two) times daily.       LOSARTAN POTASSIUM PO Take 100 mg by mouth.       Multiple Vitamin (MULTIVITAMIN) capsule Take 1 capsule by mouth daily.       pravastatin (PRAVACHOL) 20 MG tablet Take 20 mg by mouth daily.       bisacodyl (DULCOLAX) 10 MG suppository Place 1 suppository (10 mg total) rectally as needed for moderate constipation. (Patient not taking: Reported on 06/25/2022) 12 suppository 0   influenza vaccine adjuvanted (FLUAD) 0.5 ML injection Inject into the muscle. 0.5 mL 0   Omega-3 Fatty Acids (FISH OIL) 1000 MG CAPS Take by mouth daily.   (Patient not taking: Reported on 05/29/2021)     polyethylene glycol (MIRALAX) 17 g packet Take 17 g by mouth daily. (Patient not taking: Reported on 06/25/2022) 30 each 1   predniSONE (STERAPRED UNI-PAK) 5  MG TABS tablet Take 1 tablet (5 mg total) by mouth as directed. (Patient not taking: Reported on 05/29/2021) 48 tablet 0   traMADol-acetaminophen (ULTRACET) 37.5-325 MG per tablet Take 1 tablet by mouth every 4 (four) hours as needed. (Patient not taking: Reported on 05/29/2021) 30 tablet 5   Current Facility-Administered Medications  Medication Dose Route Frequency Provider Last Rate Last Admin   methylPREDNISolone acetate (DEPO-MEDROL) injection 40 mg  40 mg Intra-articular Once Carole Civil, MD       Allergies:  Patient has no known allergies.   Social History: The patient  reports that she has never smoked. She does not have any smokeless tobacco history on file. She reports that she does not drink alcohol and does not  use drugs.   Family History: The patient's family history includes Anuerysm in her mother; Cancer in her mother, sister, and unknown relative.   ROS:  Please see the history of present illness. Otherwise, complete review of systems is positive for none.  All other systems are reviewed and negative.   Physical Exam: VS:  Ht 5' 6.5" (1.689 m)   Wt 138 lb 9.6 oz (62.9 kg)   BMI 22.04 kg/m , BMI Body mass index is 22.04 kg/m.  Wt Readings from Last 3 Encounters:  06/25/22 138 lb 9.6 oz (62.9 kg)  12/07/21 128 lb (58.1 kg)  05/29/21 137 lb 12.8 oz (62.5 kg)    General: Patient appears comfortable at rest. HEENT: Conjunctiva and lids normal, oropharynx clear with moist mucosa. Neck: Supple, no elevated JVP or carotid bruits, no thyromegaly. Lungs: Clear to auscultation, nonlabored breathing at rest. Cardiac: Regular rate and rhythm, no S3 or significant systolic murmur, no pericardial rub. Abdomen: Soft, nontender, no hepatomegaly, bowel sounds present, no guarding or rebound. Extremities: No pitting edema, distal pulses 2+. Skin: Warm and dry. Musculoskeletal: No kyphosis. Neuropsychiatric: Alert and oriented x3, affect grossly appropriate.  ECG:  NSR  Recent Labwork: No results found for requested labs within last 365 days.     Component Value Date/Time   CHOL 200 12/28/2008 2058   TRIG 103 12/28/2008 2058   HDL 64 12/28/2008 2058   CHOLHDL 3.1 Ratio 12/28/2008 2058   VLDL 21 12/28/2008 2058   [place] 115 (H) 12/28/2008 2058    Other Studies Reviewed Today: Echocardiogram in 04/2021 LVEF 6065% Severe LVH with basal septal segment G1 DD RV systolic function is normal No valve abnormalities  [facility] carotid Doppler in 03/2021 Moderate homogeneous plaque with no hemodynamically significant stenosis by duplex criteria in the extracranial cerebrovascular circulation.  Event monitor in 02/2021 PAC burden 2.7% PVC burden 1.3% 6 brief episodes of PSVT, longest episode lasting  13 beats No sustained atrial or ventricular arrhythmias No pauses/conduction system abnormalities  Assessment and Plan: Patient is a 88 year old F known to have HTN, HLD, moderate carotid artery stenosis presented to cardiology clinic for follow-up visit.  # Syncope likely secondary to dehydration -After holding diuretic and increasing p.o. fluid intake in 2023, syncope episodes completely resolved. No recurrences of syncope. Echo showed severe LVH of the basal septal segment and no valve abnormalities. Event monitor showed 2.6% PAC burden and no conduction system abnormalities. No indication to start BB for PAC burden due to absence of palpitation symptoms.  # Moderate carotid artery stenosis -Continue aspirin 81 mg once daily and pravastatin 20 mg nightly.  # HTN, controlled -Continue losartan 100 mg once daily -HTN management per PCP  # HLD -Continue pravastatin 20 mg nightly -HLD  management per PCP  I have spent a total of 30 minutes with patient reviewing chart , telemetry, EKGs, labs and examining patient as well as establishing an assessment and plan that was discussed with the patient.  > 50% of time was spent in direct patient care.     Medication Adjustments/Labs and Tests Ordered: Current medicines are reviewed at length with the patient today.  Concerns regarding medicines are outlined above.   Tests Ordered: No orders of the defined types were placed in this encounter.   Medication Changes: No orders of the defined types were placed in this encounter.   Disposition:  Follow up  two year  Signed, Lamonica Trueba Fidel Levy, MD, 06/25/2022 8:54 AM    [facility] [address] [place], [place] [address]

## 2022-09-08 DIAGNOSIS — H401111 Primary open-angle glaucoma, right eye, mild stage: Secondary | ICD-10-CM | POA: Diagnosis not present

## 2022-10-01 DIAGNOSIS — M1990 Unspecified osteoarthritis, unspecified site: Secondary | ICD-10-CM | POA: Diagnosis not present

## 2022-10-01 DIAGNOSIS — Z8249 Family history of ischemic heart disease and other diseases of the circulatory system: Secondary | ICD-10-CM | POA: Diagnosis not present

## 2022-10-01 DIAGNOSIS — H3530 Unspecified macular degeneration: Secondary | ICD-10-CM | POA: Diagnosis not present

## 2022-10-01 DIAGNOSIS — I129 Hypertensive chronic kidney disease with stage 1 through stage 4 chronic kidney disease, or unspecified chronic kidney disease: Secondary | ICD-10-CM | POA: Diagnosis not present

## 2022-10-01 DIAGNOSIS — K5900 Constipation, unspecified: Secondary | ICD-10-CM | POA: Diagnosis not present

## 2022-10-01 DIAGNOSIS — H409 Unspecified glaucoma: Secondary | ICD-10-CM | POA: Diagnosis not present

## 2022-10-01 DIAGNOSIS — E785 Hyperlipidemia, unspecified: Secondary | ICD-10-CM | POA: Diagnosis not present

## 2022-10-01 DIAGNOSIS — M545 Low back pain, unspecified: Secondary | ICD-10-CM | POA: Diagnosis not present

## 2022-10-01 DIAGNOSIS — N182 Chronic kidney disease, stage 2 (mild): Secondary | ICD-10-CM | POA: Diagnosis not present

## 2023-02-03 DIAGNOSIS — N390 Urinary tract infection, site not specified: Secondary | ICD-10-CM | POA: Diagnosis not present

## 2023-02-03 DIAGNOSIS — Z6821 Body mass index (BMI) 21.0-21.9, adult: Secondary | ICD-10-CM | POA: Diagnosis not present

## 2023-02-03 DIAGNOSIS — M150 Primary generalized (osteo)arthritis: Secondary | ICD-10-CM | POA: Diagnosis not present

## 2023-02-03 DIAGNOSIS — E7800 Pure hypercholesterolemia, unspecified: Secondary | ICD-10-CM | POA: Diagnosis not present

## 2023-02-03 DIAGNOSIS — I10 Essential (primary) hypertension: Secondary | ICD-10-CM | POA: Diagnosis not present

## 2023-02-18 DIAGNOSIS — Z23 Encounter for immunization: Secondary | ICD-10-CM | POA: Diagnosis not present

## 2023-03-19 DIAGNOSIS — H35033 Hypertensive retinopathy, bilateral: Secondary | ICD-10-CM | POA: Diagnosis not present

## 2023-03-19 DIAGNOSIS — H524 Presbyopia: Secondary | ICD-10-CM | POA: Diagnosis not present

## 2023-07-06 DIAGNOSIS — M159 Polyosteoarthritis, unspecified: Secondary | ICD-10-CM | POA: Diagnosis not present

## 2023-07-06 DIAGNOSIS — Z6821 Body mass index (BMI) 21.0-21.9, adult: Secondary | ICD-10-CM | POA: Diagnosis not present

## 2023-07-06 DIAGNOSIS — E7800 Pure hypercholesterolemia, unspecified: Secondary | ICD-10-CM | POA: Diagnosis not present

## 2023-07-06 DIAGNOSIS — I10 Essential (primary) hypertension: Secondary | ICD-10-CM | POA: Diagnosis not present

## 2023-07-06 DIAGNOSIS — M150 Primary generalized (osteo)arthritis: Secondary | ICD-10-CM | POA: Diagnosis not present

## 2023-07-06 DIAGNOSIS — R051 Acute cough: Secondary | ICD-10-CM | POA: Diagnosis not present

## 2023-08-31 DIAGNOSIS — H353132 Nonexudative age-related macular degeneration, bilateral, intermediate dry stage: Secondary | ICD-10-CM | POA: Diagnosis not present

## 2023-12-10 DIAGNOSIS — H401111 Primary open-angle glaucoma, right eye, mild stage: Secondary | ICD-10-CM | POA: Diagnosis not present

## 2024-02-01 DIAGNOSIS — M150 Primary generalized (osteo)arthritis: Secondary | ICD-10-CM | POA: Diagnosis not present

## 2024-02-01 DIAGNOSIS — E7800 Pure hypercholesterolemia, unspecified: Secondary | ICD-10-CM | POA: Diagnosis not present

## 2024-02-01 DIAGNOSIS — I10 Essential (primary) hypertension: Secondary | ICD-10-CM | POA: Diagnosis not present

## 2024-02-01 DIAGNOSIS — N390 Urinary tract infection, site not specified: Secondary | ICD-10-CM | POA: Diagnosis not present

## 2024-02-01 DIAGNOSIS — Z23 Encounter for immunization: Secondary | ICD-10-CM | POA: Diagnosis not present
